# Patient Record
Sex: Female | Born: 1935 | Race: White | Hispanic: No | Smoking: Former smoker
Health system: Southern US, Community
[De-identification: ages and names within clinical notes are randomized; demographics above are authoritative.]

## PROBLEM LIST (undated history)

## (undated) DIAGNOSIS — D500 Iron deficiency anemia secondary to blood loss (chronic): Secondary | ICD-10-CM

## (undated) DIAGNOSIS — H35033 Hypertensive retinopathy, bilateral: Secondary | ICD-10-CM

## (undated) DIAGNOSIS — R1310 Dysphagia, unspecified: Secondary | ICD-10-CM

## (undated) DIAGNOSIS — G4700 Insomnia, unspecified: Secondary | ICD-10-CM

## (undated) DIAGNOSIS — F32A Depression, unspecified: Secondary | ICD-10-CM

## (undated) DIAGNOSIS — R159 Full incontinence of feces: Secondary | ICD-10-CM

## (undated) DIAGNOSIS — F01511 Vascular dementia, unspecified severity, with agitation: Secondary | ICD-10-CM

## (undated) DIAGNOSIS — K5790 Diverticulosis of intestine, part unspecified, without perforation or abscess without bleeding: Secondary | ICD-10-CM

## (undated) DIAGNOSIS — J449 Chronic obstructive pulmonary disease, unspecified: Secondary | ICD-10-CM

## (undated) DIAGNOSIS — R2689 Other abnormalities of gait and mobility: Secondary | ICD-10-CM

## (undated) DIAGNOSIS — K5792 Diverticulitis of intestine, part unspecified, without perforation or abscess without bleeding: Secondary | ICD-10-CM

## (undated) DIAGNOSIS — I639 Cerebral infarction, unspecified: Secondary | ICD-10-CM

## (undated) DIAGNOSIS — Z9049 Acquired absence of other specified parts of digestive tract: Secondary | ICD-10-CM

## (undated) DIAGNOSIS — E7800 Pure hypercholesterolemia, unspecified: Secondary | ICD-10-CM

## (undated) DIAGNOSIS — I10 Essential (primary) hypertension: Secondary | ICD-10-CM

## (undated) DIAGNOSIS — S72141A Displaced intertrochanteric fracture of right femur, initial encounter for closed fracture: Secondary | ICD-10-CM

## (undated) DIAGNOSIS — N189 Chronic kidney disease, unspecified: Secondary | ICD-10-CM

## (undated) DIAGNOSIS — I700 Atherosclerosis of aorta: Secondary | ICD-10-CM

## (undated) DIAGNOSIS — I4892 Unspecified atrial flutter: Secondary | ICD-10-CM

## (undated) DIAGNOSIS — K219 Gastro-esophageal reflux disease without esophagitis: Secondary | ICD-10-CM

## (undated) DIAGNOSIS — I4891 Unspecified atrial fibrillation: Secondary | ICD-10-CM

## (undated) DIAGNOSIS — H3530 Unspecified macular degeneration: Secondary | ICD-10-CM

## (undated) DIAGNOSIS — E785 Hyperlipidemia, unspecified: Secondary | ICD-10-CM

## (undated) HISTORY — DX: Vascular dementia, unspecified severity, with agitation: F01.511

## (undated) HISTORY — DX: Diverticulosis of intestine, part unspecified, without perforation or abscess without bleeding: K57.90

## (undated) HISTORY — DX: Chronic kidney disease, unspecified: N18.9

## (undated) HISTORY — DX: Cerebral infarction, unspecified: I63.9

## (undated) HISTORY — DX: Unspecified atrial flutter: I48.92

## (undated) HISTORY — DX: Hypertensive retinopathy, bilateral: H35.033

## (undated) HISTORY — PX: ABDOMINAL HYSTERECTOMY: SHX81

## (undated) HISTORY — PX: TONSILLECTOMY: SUR1361

## (undated) HISTORY — DX: Diverticulitis of intestine, part unspecified, without perforation or abscess without bleeding: K57.92

## (undated) HISTORY — PX: APPENDECTOMY: SHX54

## (undated) HISTORY — PX: KNEE SURGERY: SHX244

## (undated) HISTORY — DX: Dysphagia, unspecified: R13.10

## (undated) HISTORY — PX: CHOLECYSTECTOMY: SHX55

## (undated) HISTORY — DX: Insomnia, unspecified: G47.00

## (undated) HISTORY — DX: Other abnormalities of gait and mobility: R26.89

## (undated) HISTORY — DX: Gastro-esophageal reflux disease without esophagitis: K21.9

## (undated) HISTORY — DX: Iron deficiency anemia secondary to blood loss (chronic): D50.0

## (undated) HISTORY — DX: Acquired absence of other specified parts of digestive tract: Z90.49

## (undated) HISTORY — DX: Full incontinence of feces: R15.9

## (undated) HISTORY — PX: TOTAL HIP ARTHROPLASTY: SHX124

## (undated) HISTORY — PX: PARTIAL HIP ARTHROPLASTY: SHX733

## (undated) HISTORY — DX: Atherosclerosis of aorta: I70.0

## (undated) HISTORY — DX: Unspecified atrial fibrillation: I48.91

## (undated) HISTORY — DX: Displaced intertrochanteric fracture of right femur, initial encounter for closed fracture: S72.141A

## (undated) HISTORY — DX: Unspecified macular degeneration: H35.30

## (undated) HISTORY — PX: ESOPHAGEAL DILATION: SHX303

## (undated) HISTORY — DX: Depression, unspecified: F32.A

## (undated) HISTORY — DX: Hyperlipidemia, unspecified: E78.5

## (undated) HISTORY — PX: OTHER SURGICAL HISTORY: SHX169

## (undated) HISTORY — DX: Chronic obstructive pulmonary disease, unspecified: J44.9

---

## 2014-10-17 DIAGNOSIS — M7989 Other specified soft tissue disorders: Secondary | ICD-10-CM | POA: Diagnosis not present

## 2014-10-17 DIAGNOSIS — M79609 Pain in unspecified limb: Secondary | ICD-10-CM | POA: Diagnosis not present

## 2014-10-17 DIAGNOSIS — M79605 Pain in left leg: Secondary | ICD-10-CM | POA: Diagnosis not present

## 2014-11-21 DIAGNOSIS — Z0000 Encounter for general adult medical examination without abnormal findings: Secondary | ICD-10-CM | POA: Diagnosis not present

## 2014-11-21 DIAGNOSIS — Z9181 History of falling: Secondary | ICD-10-CM | POA: Diagnosis not present

## 2014-11-21 DIAGNOSIS — S8012XA Contusion of left lower leg, initial encounter: Secondary | ICD-10-CM | POA: Diagnosis not present

## 2014-12-30 DIAGNOSIS — Z1231 Encounter for screening mammogram for malignant neoplasm of breast: Secondary | ICD-10-CM | POA: Diagnosis not present

## 2015-01-14 DIAGNOSIS — R110 Nausea: Secondary | ICD-10-CM | POA: Diagnosis not present

## 2015-01-19 DIAGNOSIS — Z79899 Other long term (current) drug therapy: Secondary | ICD-10-CM | POA: Diagnosis not present

## 2015-01-19 DIAGNOSIS — R1031 Right lower quadrant pain: Secondary | ICD-10-CM | POA: Diagnosis not present

## 2015-01-19 DIAGNOSIS — I10 Essential (primary) hypertension: Secondary | ICD-10-CM | POA: Diagnosis not present

## 2015-01-19 DIAGNOSIS — R197 Diarrhea, unspecified: Secondary | ICD-10-CM | POA: Diagnosis not present

## 2015-01-19 DIAGNOSIS — K219 Gastro-esophageal reflux disease without esophagitis: Secondary | ICD-10-CM | POA: Diagnosis not present

## 2015-01-19 DIAGNOSIS — Z7982 Long term (current) use of aspirin: Secondary | ICD-10-CM | POA: Diagnosis not present

## 2015-01-19 DIAGNOSIS — K769 Liver disease, unspecified: Secondary | ICD-10-CM | POA: Diagnosis not present

## 2015-01-19 DIAGNOSIS — N281 Cyst of kidney, acquired: Secondary | ICD-10-CM | POA: Diagnosis not present

## 2015-01-19 DIAGNOSIS — R109 Unspecified abdominal pain: Secondary | ICD-10-CM | POA: Diagnosis not present

## 2015-01-23 DIAGNOSIS — R197 Diarrhea, unspecified: Secondary | ICD-10-CM | POA: Diagnosis not present

## 2015-01-24 DIAGNOSIS — R197 Diarrhea, unspecified: Secondary | ICD-10-CM | POA: Diagnosis not present

## 2015-03-13 ENCOUNTER — Encounter (HOSPITAL_COMMUNITY): Payer: Self-pay

## 2015-03-13 ENCOUNTER — Emergency Department (HOSPITAL_COMMUNITY): Payer: Medicare Other

## 2015-03-13 ENCOUNTER — Inpatient Hospital Stay (HOSPITAL_COMMUNITY)
Admission: EM | Admit: 2015-03-13 | Discharge: 2015-03-15 | DRG: 310 | Disposition: A | Discharge status: Home / Self Care | Payer: Medicare Other | Attending: Internal Medicine | Admitting: Internal Medicine

## 2015-03-13 DIAGNOSIS — R079 Chest pain, unspecified: Secondary | ICD-10-CM | POA: Diagnosis present

## 2015-03-13 DIAGNOSIS — I10 Essential (primary) hypertension: Secondary | ICD-10-CM | POA: Diagnosis not present

## 2015-03-13 DIAGNOSIS — Z87891 Personal history of nicotine dependence: Secondary | ICD-10-CM

## 2015-03-13 DIAGNOSIS — I209 Angina pectoris, unspecified: Secondary | ICD-10-CM | POA: Diagnosis not present

## 2015-03-13 DIAGNOSIS — Z7982 Long term (current) use of aspirin: Secondary | ICD-10-CM

## 2015-03-13 DIAGNOSIS — E785 Hyperlipidemia, unspecified: Secondary | ICD-10-CM | POA: Diagnosis present

## 2015-03-13 DIAGNOSIS — R61 Generalized hyperhidrosis: Secondary | ICD-10-CM | POA: Diagnosis present

## 2015-03-13 DIAGNOSIS — R0789 Other chest pain: Secondary | ICD-10-CM | POA: Diagnosis not present

## 2015-03-13 DIAGNOSIS — J449 Chronic obstructive pulmonary disease, unspecified: Secondary | ICD-10-CM | POA: Diagnosis not present

## 2015-03-13 DIAGNOSIS — I4891 Unspecified atrial fibrillation: Secondary | ICD-10-CM | POA: Diagnosis not present

## 2015-03-13 DIAGNOSIS — Z79899 Other long term (current) drug therapy: Secondary | ICD-10-CM | POA: Diagnosis not present

## 2015-03-13 HISTORY — DX: Essential (primary) hypertension: I10

## 2015-03-13 HISTORY — DX: Pure hypercholesterolemia, unspecified: E78.00

## 2015-03-13 LAB — CBC
HCT: 41.9 % (ref 36.0–46.0)
Hemoglobin: 14.4 g/dL (ref 12.0–15.0)
MCH: 32 pg (ref 26.0–34.0)
MCHC: 34.4 g/dL (ref 30.0–36.0)
MCV: 93.1 fL (ref 78.0–100.0)
Platelets: 306 10*3/uL (ref 150–400)
RBC: 4.5 MIL/uL (ref 3.87–5.11)
RDW: 12.7 % (ref 11.5–15.5)
WBC: 8.4 10*3/uL (ref 4.0–10.5)

## 2015-03-13 LAB — BASIC METABOLIC PANEL
Anion gap: 10 (ref 5–15)
BUN: 8 mg/dL (ref 6–20)
CO2: 24 mmol/L (ref 22–32)
Calcium: 9.8 mg/dL (ref 8.9–10.3)
Chloride: 107 mmol/L (ref 101–111)
Creatinine, Ser: 0.66 mg/dL (ref 0.44–1.00)
GFR calc Af Amer: >60 mL/min (ref >60)
GFR calc non Af Amer: >60 mL/min (ref >60)
Glucose, Bld: 100 mg/dL — ABNORMAL HIGH (ref 65–99)
Potassium: 4.2 mmol/L (ref 3.5–5.1)
Sodium: 141 mmol/L (ref 135–145)

## 2015-03-13 LAB — TROPONIN I
Troponin I: <0.03 ng/mL (ref <0.031)
Troponin I: <0.03 ng/mL (ref <0.031)

## 2015-03-13 LAB — I-STAT TROPONIN, ED: Troponin i, poc: 0.01 ng/mL (ref 0.00–0.08)

## 2015-03-13 LAB — TSH: TSH: 2.402 u[IU]/mL (ref 0.350–4.500)

## 2015-03-13 MED ORDER — DILTIAZEM HCL 25 MG/5ML IV SOLN
15.0000 mg | Freq: Once | INTRAVENOUS | Status: AC
  Administered 2015-03-13: 15 mg via INTRAVENOUS
  Filled 2015-03-13: qty 5

## 2015-03-13 MED ORDER — ONDANSETRON HCL 4 MG/2ML IJ SOLN: 4.0000 mg | Freq: Four times a day (QID) | INTRAMUSCULAR | Status: DC | PRN

## 2015-03-13 MED ORDER — ACETAMINOPHEN 325 MG PO TABS
650.0000 mg | ORAL_TABLET | ORAL | Status: DC | PRN
  Administered 2015-03-14: 650 mg via ORAL
  Filled 2015-03-13: qty 2

## 2015-03-13 MED ORDER — ASPIRIN EC 81 MG PO TBEC
81.0000 mg | DELAYED_RELEASE_TABLET | Freq: Every day | ORAL | Status: DC
  Administered 2015-03-14 – 2015-03-15 (×2): 81 mg via ORAL
  Filled 2015-03-13 (×2): qty 1

## 2015-03-13 MED ORDER — HEPARIN (PORCINE) IN NACL 100-0.45 UNIT/ML-% IJ SOLN
800.0000 [IU]/h | INTRAMUSCULAR | Status: DC
  Administered 2015-03-13: 800 [IU]/h via INTRAVENOUS
  Filled 2015-03-13: qty 250

## 2015-03-13 MED ORDER — ADULT MULTIVITAMIN W/MINERALS CH
1.0000 | ORAL_TABLET | Freq: Every day | ORAL | Status: DC
  Administered 2015-03-14 – 2015-03-15 (×2): 1 via ORAL
  Filled 2015-03-13 (×2): qty 1

## 2015-03-13 MED ORDER — ASPIRIN 81 MG PO CHEW
243.0000 mg | CHEWABLE_TABLET | Freq: Once | ORAL | Status: AC
  Administered 2015-03-13: 243 mg via ORAL
  Filled 2015-03-13: qty 3

## 2015-03-13 MED ORDER — APIXABAN 5 MG PO TABS
5.0000 mg | ORAL_TABLET | Freq: Two times a day (BID) | ORAL | Status: DC
  Administered 2015-03-13 – 2015-03-15 (×4): 5 mg via ORAL
  Filled 2015-03-13 (×5): qty 1

## 2015-03-13 MED ORDER — HEPARIN BOLUS VIA INFUSION
3000.0000 [IU] | Freq: Once | INTRAVENOUS | Status: AC
  Administered 2015-03-13: 3000 [IU] via INTRAVENOUS
  Filled 2015-03-13: qty 3000

## 2015-03-13 MED ORDER — CALCIUM CARB-CHOLECALCIFEROL 600-800 MG-UNIT PO TABS: 1.0000 | ORAL_TABLET | Freq: Every evening | ORAL | Status: DC

## 2015-03-13 MED ORDER — DILTIAZEM HCL ER COATED BEADS 120 MG PO CP24
120.0000 mg | ORAL_CAPSULE | Freq: Every day | ORAL | Status: DC
  Administered 2015-03-14 – 2015-03-15 (×2): 120 mg via ORAL
  Filled 2015-03-13 (×2): qty 1

## 2015-03-13 MED ORDER — ASPIRIN 81 MG PO CHEW: 324.0000 mg | CHEWABLE_TABLET | Freq: Once | ORAL | Status: DC

## 2015-03-13 MED ORDER — CALCIUM CARBONATE-VITAMIN D 500-200 MG-UNIT PO TABS
1.0000 | ORAL_TABLET | Freq: Every day | ORAL | Status: DC
Start: 2015-03-13 — End: 2015-03-15
  Administered 2015-03-14: 1 via ORAL
  Filled 2015-03-13: qty 1

## 2015-03-13 MED ORDER — PANTOPRAZOLE SODIUM 40 MG PO TBEC
40.0000 mg | DELAYED_RELEASE_TABLET | Freq: Every day | ORAL | Status: DC
  Administered 2015-03-14 – 2015-03-15 (×2): 40 mg via ORAL
  Filled 2015-03-13 (×2): qty 1

## 2015-03-13 MED ORDER — DILTIAZEM HCL 30 MG PO TABS
30.0000 mg | ORAL_TABLET | Freq: Two times a day (BID) | ORAL | Status: DC
  Administered 2015-03-13: 30 mg via ORAL
  Filled 2015-03-13 (×2): qty 1

## 2015-03-13 MED ORDER — COLESTIPOL HCL 1 G PO TABS
1.0000 g | ORAL_TABLET | Freq: Two times a day (BID) | ORAL | Status: DC
  Administered 2015-03-13 – 2015-03-15 (×4): 1 g via ORAL
  Filled 2015-03-13 (×7): qty 1

## 2015-03-13 NOTE — ED Provider Notes (Signed)
CSN: [id number]     Arrival date & time 03/13/15  1014 History   First MD Initiated Contact with Patient 03/13/15 1024     Chief Complaint  Patient presents with  . Atrial Fibrillation   HPI  Gail Mcclure is a 81yo female presenting with atrial fibrillation. Woke up this morning with LUQ pain and diaphoresis. States the diaphoresis was so significant that she was soaking wet and had to change her pajamas. Also states she was very weak and had a hard time getting out of bed. Has continued to feel weak all morning, however LUQ pain has resolved. Initially presented to Urgent Care and was noted to have atrial fibrillation and possible inferior infarct and was sent to ED. Denies history of heart problems. States she [place], but for the last month she has only been able to make two laps due to shortness of breath. Also notes left leg swelling. Denies chest pain or radiation to the left arm or neck. Denies orthopnea and paroxysmal nocturnal dyspnea. Denies palpitations.  Past Medical History  Diagnosis Date  . Hypertension   . High cholesterol    Past Surgical History  Procedure Laterality Date  . Abdominal hysterectomy    . Cholecystectomy    . Appendectomy    . Tonsillectomy    . Knee surgery     History reviewed. No pertinent family history. Social History  Substance Use Topics  . Smoking status: Former Games developer  . Smokeless tobacco: None  . Alcohol Use: No   OB History    No data available     Review of Systems  Constitutional: Positive for diaphoresis.  Respiratory: Positive for shortness of breath.   Cardiovascular: Positive for leg swelling. Negative for chest pain and palpitations.  Gastrointestinal: Positive for abdominal pain. Negative for nausea, vomiting, diarrhea and constipation.  Neurological: Positive for weakness.      Allergies  Review of patient's allergies indicates no known allergies.  Home Medications   Prior to  Admission medications   Medication Sig Start Date End Date Taking? Authorizing Provider  acetaminophen (TYLENOL) 500 MG tablet Take 1,000 mg by mouth every 6 (six) hours as needed for mild pain.   Yes Historical Provider, MD  amLODipine (NORVASC) 10 MG tablet Take 10 mg by mouth daily. 12/14/14  Yes Historical Provider, MD  aspirin EC 81 MG tablet Take 81 mg by mouth daily.   Yes Historical Provider, MD  Calcium Carb-Cholecalciferol (CALCIUM 600/VITAMIN D3) 600-800 MG-UNIT TABS Take 1 tablet by mouth every evening.   Yes Historical Provider, MD  colestipol (COLESTID) 1 G tablet Take 1 g by mouth 2 (two) times daily. 02/24/15  Yes Historical Provider, MD  Multiple Vitamin (MULTIVITAMIN WITH MINERALS) TABS tablet Take 1 tablet by mouth daily.   Yes Historical Provider, MD  omeprazole (PRILOSEC) 20 MG capsule Take 20 mg by mouth daily. 02/20/15  Yes Historical Provider, MD   BP 152/56 mmHg  Pulse 98  Temp(Src) 97.9 F (36.6 C) (Oral)  Resp 23  Ht 5\' 5"  (1.651 m)  Wt 143 lb (64.864 kg)  BMI 23.80 kg/m2  SpO2 97% Physical Exam  Constitutional: She is oriented to person, place, and time. She appears well-developed and well-nourished. No distress.  HENT:  Head: Normocephalic and atraumatic.  Cardiovascular: Normal rate and regular rhythm.  Exam reveals no gallop and no friction rub.   No murmur heard. Pulmonary/Chest: Effort normal. No respiratory distress. She has no rales.  Abdominal: Soft. Bowel sounds are normal. She exhibits no distension. There is no tenderness.  Musculoskeletal: She exhibits no tenderness.  Nonpitting edema of left leg  Neurological: She is alert and oriented to person, place, and time.  Skin: Skin is warm and dry. No rash noted. She is not diaphoretic.  Psychiatric: She has a normal mood and affect. Her behavior is normal.    ED Course  Procedures (including critical care time) Labs Review Labs Reviewed  BASIC METABOLIC PANEL - Abnormal; Notable for the following:     Glucose, Bld 100 (*)    All other components within normal limits  CBC  TROPONIN I  TSH  TROPONIN I  TROPONIN I  HEPARIN LEVEL (UNFRACTIONATED)  Rosezena Sensor, ED    Imaging Review Dg Chest 2 View  03/13/2015   CLINICAL DATA:  New onset atrial fibrillation. Patient awoke with chest pain last night.  EXAM: CHEST  2 VIEW  COMPARISON:  None.  FINDINGS: Hyperinflation is present compatible with emphysema. Osteopenia. Mild thoracic kyphosis.  The cardiopericardial silhouette is within normal limits. No airspace disease. No effusion. Monitoring leads project over the chest.  IMPRESSION: No acute cardiopulmonary disease. Mild hyperinflation suggesting emphysema.   Electronically Signed   By: Andreas Newport M.D.   On: 03/13/2015 11:59   I have personally reviewed and evaluated these images and lab results as part of my medical decision-making.   EKG Interpretation   Date/Time:  Thursday March 13 2015 10:17:21 EDT Ventricular Rate:  126 PR Interval:    QRS Duration: 86 QT Interval:  293 QTC Calculation: 424 R Axis:   80 Text Interpretation:  Atrial flutter Low voltage, precordial leads ST  depression, probably rate related Confirmed by Juleen China  MD, STEPHEN ([id number])  on 03/13/2015 10:57:05 AM      MDM   Final diagnoses:  None   New onset atrial fibrillation. Troponin normal. BMP normal. CBC normal. TSH normal. CXR without active cardiopulmonary disease.    Hospitalist contacted and will admit for workup of new onset atrial fibrillation. Anticipate need for echo given atrial fibrillation and decreased exercise tolerance.     [place], [place] 03/13/15 [address] [place], [place] 03/13/15 1416  Raeford Razor, MD 03/14/15 0900

## 2015-03-13 NOTE — Progress Notes (Addendum)
ANTICOAGULATION CONSULT NOTE - Initial Consult  Pharmacy Consult for heparin Indication: atrial fibrillation  No Known Allergies  Patient Measurements: Height:  (165.1 cm) Weight: 143 lb (64.864 kg) IBW/kg (Calculated) : 57 Heparin Dosing Weight: 64.8kg  Vital Signs: Temp: 97.9 F (36.6 C) (08/18 1018) Temp Source: Oral (08/18 1018) BP: 152/56 mmHg (08/18 1345) Pulse Rate: 98 (08/18 1345)  Labs:  Recent Labs  03/13/15 1115  HGB 14.4  HCT 41.9  PLT 306  CREATININE 0.66  TROPONINI <0.03    Estimated Creatinine Clearance: 44.6 mL/min (by C-G formula based on Cr of 0.66).   Medical History: Past Medical History  Diagnosis Date  . Hypertension   . High cholesterol     Assessment: 61 YOF here with left flank pain and diaphoresis who was seen at urgent care and sent to ED with EKG showing AFib, Patient not on anticoagulation PTA. Initial troponin negative, TSH nml at 2.4.  Baseline hgb 14.4, plts 306- no bleeding noted at baseline.  Goal of Therapy:  Heparin level 0.3-0.7 units/ml Monitor platelets by anticoagulation protocol: Yes   Plan:  -heparin bolus with 3000 units IV x1, then start infusion at 800 units/hr -HL in 8h -daily HL and CBC -follow for s/s bleeding  Lauren D. Bajbus, PharmD, BCPS Clinical Pharmacist Pager: [phone] 03/13/2015 2:07 PM   Addendum: Now transitioning to apixaban. Age 81, weight 64.9kg and Scr 0.66 so  BID is appropriate for her. Baseline CBC is WNL.  Plan: - Apixaban  PO BID - F/u renal fxn, S&S of bleeding - Will educate patient prior to discharge  Lysle Pearl, PharmD, BCPS Pager # [phone] 03/13/2015 3:17 PM

## 2015-03-13 NOTE — ED Provider Notes (Signed)
I saw and evaluated the patient, reviewed the resident's note and I agree with the findings and plan.   EKG Interpretation   Date/Time:  Thursday March 13 2015 10:17:21 EDT Ventricular Rate:  126 PR Interval:    QRS Duration: 86 QT Interval:  293 QTC Calculation: 424 R Axis:   80 Text Interpretation:  Atrial flutter Low voltage, precordial leads ST  depression, probably rate related Confirmed by Sue Fernicola  MD, Shyvonne Chastang ([id number])  on 03/13/2015 10:57:05 AM      89yF with new onset afib. Onset not completely clear. Reports L sided CP while laying in bed this morning. Atypical in that it occurred at rest and only lasted about a minute. Currently resolved and has not had it since. On ROS she reports about a two week history of increased fatigue/exercise intolerance. Monday -Thursday she walks around the [facility] in [place] four times. Over the past couple weeks she has only been able to manage walking around twice. She has had to stop sooner because she felt very tired. Denies having any pain with this.   No known hx of CAD. CHADSVASC score of 4 with advanced age, female and hx of HTN.   Rate ~100-130. Will try to rate control with cardizem. Aspirin for now. Will discuss with cardiology in terms of anticoagulation and other medication recommendations as well potential need for ischemic work-up.   Raeford Razor, MD 03/13/15 [phone]

## 2015-03-13 NOTE — Discharge Planning (Signed)
ER NCM requested benefits check for Xarelto, Pradaxa and Eliquis.  Will update bedside NCM when results become available.

## 2015-03-13 NOTE — ED Notes (Signed)
Pt on bedside commode. Family in room with pt. Informed to let staff know when pt is finished and wishes to return to the bed so that assistance can be provided.

## 2015-03-13 NOTE — Consult Note (Signed)
CARDIOLOGY CONSULT NOTE   Patient ID: Gail Mcclure MRN: [id number] DOB/AGE: August 19, 1925 81 y.o.  Admit date: 03/13/2015  Primary Physician   Desmond Dike, MD Primary Cardiologist  New  Reason for Consultation   Afib with RVR  HPI: Gail Mcclure is a 81 y.o. female with a history of HTN and HL, former smoker (quit 53 years ago) presented to [facility] ED for evaluation of weakness and fatigue.   No other prior cardiac hx. She walks 4 laps around [facility] daily however decreased to lap secondary to shortness of breath. Over 2 week period she is also noticed increased focal left ankle swelling. This morning she woke up with a diaphoresis and dizziness and then she suddenly felt Left lateral chest discomfort. Described a mild ache, last for about 1 min and subsided by itself. She denies palpitation, orthopnea or PND. About 2-4 weeks ago patient had a diarrhea, now resoled. She mows her 2 acre lawn on a riding lawnmower, recently hired an individual. She presented here today for further evaluation.    In the ER the patient was afebrile and found to have an atrial fibrillation with RVR at rate of 126, BP was 155/74.  Patient was given a dose of IV Cardizem with subsequent decrease in heart rate, conversion to sinus rhythm. Discontinued IV dilt, currently in rate controlled afib.   initial troponin was less than 0.03, TSH was 2.4, chest x-ray revealed mild hyperinflation suggestive of emphysema.  Cardiology is consulted for further management.    Past Medical History  Diagnosis Date  . Hypertension   . High cholesterol      Past Surgical History  Procedure Laterality Date  . Abdominal hysterectomy    . Cholecystectomy    . Appendectomy    . Tonsillectomy    . Knee surgery      No Known Allergies  I have reviewed the patient's current medications . aspirin EC  81 mg Oral Daily  . diltiazem  30 mg Oral Q12H  . heparin  3,000 Units Intravenous Once   . heparin      acetaminophen, ondansetron (ZOFRAN) IV  Prior to Admission medications   Medication Sig Start Date End Date Taking? Authorizing Provider  acetaminophen (TYLENOL) 500 MG tablet Take 1,000 mg by mouth every 6 (six) hours as needed for mild pain.   Yes Historical Provider, MD  amLODipine (NORVASC) 10 MG tablet Take 10 mg by mouth daily. 12/14/14  Yes Historical Provider, MD  aspirin EC 81 MG tablet Take 81 mg by mouth daily.   Yes Historical Provider, MD  Calcium Carb-Cholecalciferol (CALCIUM 600/VITAMIN D3) 600-800 MG-UNIT TABS Take 1 tablet by mouth every evening.   Yes Historical Provider, MD  colestipol (COLESTID) 1 G tablet Take 1 g by mouth 2 (two) times daily. 02/24/15  Yes Historical Provider, MD  Multiple Vitamin (MULTIVITAMIN WITH MINERALS) TABS tablet Take 1 tablet by mouth daily.   Yes Historical Provider, MD  omeprazole (PRILOSEC) 20 MG capsule Take 20 mg by mouth daily. 02/20/15  Yes Historical Provider, MD     Social History   Social History  . Marital Status: Unknown    Spouse Name: N/A  . Number of Children: N/A  . Years of Education: N/A   Occupational History  . Not on file.   Social History Main Topics  . Smoking status: Former Games developer  . Smokeless tobacco: Not on file  . Alcohol Use: No  . Drug Use: No  . Sexual Activity:  Not on file   Other Topics Concern  . Not on file   Social History Narrative  . No narrative on file    No family status information on file.   History reviewed. No pertinent family history.   ROS:  Full 14 point review of systems complete and found to be negative unless listed above.  Physical Exam: Blood pressure 135/75, pulse 93, temperature 97.9 F (36.6 C), temperature source Oral, resp. rate 26, height 5\' 5"  (1.651 m), weight 143 lb (64.864 kg), SpO2 97 %.  General: Well developed, well nourished, female in no acute distress Head: Eyes PERRLA, No xanthomas. Normocephalic and atraumatic, oropharynx without edema or exudate.   Lungs: Resp regular and unlabored. Basilar expiratory wheezing.  Heart: irregular rhythm no s3, s4, or murmurs..   Neck: No carotid bruits. No lymphadenopathy.  JVD. Abdomen: Bowel sounds present, abdomen soft and non-tender without masses or hernias noted. Msk:  No spine or cva tenderness. No weakness, no joint deformities or effusions. Extremities: No clubbing, cyanosis or edema. DP/PT/Radials 2+ and equal bilaterally. Neuro: Alert and oriented X 3. No focal deficits noted. Psych:  Good affect, responds appropriately Skin: No rashes or lesions noted.  Labs:   Lab Results  Component Value Date   WBC 8.4 03/13/2015   HGB 14.4 03/13/2015   HCT 41.9 03/13/2015   MCV 93.1 03/13/2015   PLT 306 03/13/2015   No results for input(s): INR in the last 72 hours.  Recent Labs Lab 03/13/15 1115  NA 141  K 4.2  CL 107  CO2 24  BUN 8  CREATININE 0.66  CALCIUM 9.8  GLUCOSE 100*   No results found for: MG  Recent Labs  03/13/15 1115  TROPONINI <0.03    Recent Labs  03/13/15 1143  TROPIPOC 0.01    TSH  Date/Time Value Ref Range Status  03/13/2015 11:15 AM 2.402 0.350 - 4.500 uIU/mL Final   No results found for: VITAMINB12, FOLATE, FERRITIN, TIBC, IRON, RETICCTPCT  Echo: pending  ECG:  Vent. rate 126 BPM PR interval * ms QRS duration 86 ms QT/QTc 293/424 ms P-R-T axes -1 80 14  Radiology:  Dg Chest 2 View  03/13/2015   CLINICAL DATA:  New onset atrial fibrillation. Patient awoke with chest pain last night.  EXAM: CHEST  2 VIEW  COMPARISON:  None.  FINDINGS: Hyperinflation is present compatible with emphysema. Osteopenia. Mild thoracic kyphosis.  The cardiopericardial silhouette is within normal limits. No airspace disease. No effusion. Monitoring leads project over the chest.  IMPRESSION: No acute cardiopulmonary disease. Mild hyperinflation suggesting emphysema.   Electronically Signed   By: Andreas Newport M.D.   On: 03/13/2015 11:59    ASSESSMENT AND PLAN:     1.  New onset atrial fibrillation  - CHADVASc = 4 (Age 48, sex and HTN). - Presented wit Afib RVR, rate improved on IV dilt, now discontinued. Currently in rate controlled afib.  - TSH normal, Echo pending - Unknown etiology and duration. Less likely of ischemic etiology. She woke up with a diaphoresis and then had left lateral dull achy pain, resolved in 1 minute.  - Will place her on a Cardizem CD 120 and eliquis per pharmacy.   2. Atypical chest pain - Top X 1 negative. EKG without acute abnormality. Continue cycle trop. Lexiscan tomorrow. NPO after midnight.  - Continue ASA - Discontinue IV heparin  3. HTN - Stable. Held home dose of norvasc 10mg .    4. HLD  - She  was on simvastatin, recently placed on Colestipol 1g tablet due to diarrhea.   SignedManson Passey, PA 03/13/2015, 2:23 PM Pager [phone]  Co-Sign MD   I have seen and examined the patient along with Bhagat,Bhavinkumar, PA.  I have reviewed the chart, notes and new data.  I agree with PA's note.  Key new complaints: feeling much better, but not back to baseline; chest pressure earlier today may have been angina; symptoms suggest arrhythmia may be going on for last week Key examination changes: no HF signs, lying fully supine, rhythm still irregular 80s, no murmurs Key new findings / data: mild ST depression on ECg  PLAN: Echo [employer] direct oral anticoagulant (for example: Eliquis 5 mg BID) Oral diltiazem No need for early cardioversion or antiarrhythmics - reassess symptoms in 3 weeks. If symptoms are well controlled, continue rate control strategy.   Gail Fair, MD, [facility] [phone] 03/13/2015, 3:45 PM

## 2015-03-13 NOTE — H&P (Signed)
[facility] History and Physical                                                                                    Gail Mcclure, is a 81 y.o. female  MRN: [id number]   DOB - September 05, 1925  Admit Date - 03/13/2015  Outpatient Primary MD for the patient is Desmond Dike, MD  Referring MD: Juleen China / ER  Consulting M.D: [facility]  With History of -  Past Medical History  Diagnosis Date  . Hypertension   . High cholesterol       Past Surgical History  Procedure Laterality Date  . Abdominal hysterectomy    . Cholecystectomy    . Appendectomy    . Tonsillectomy    . Knee surgery      in for   Chief Complaint  Patient presents with  . Atrial Fibrillation     HPI This is a very active 81 year old female patient with past medical history of hypertension and dyslipidemia as well as a remote history of tobacco abuse and found to have changes consistent with COPD on today's chest x-ray who presents with left lateral anterior chest pain as well as diaphoresis and weakness. Patient awakened around 2 AM with the above symptoms. She presented to an urgent care was found to be in atrial fibrillation so was sent to [facility] for further evaluation. Patient has noted over the past 2 weeks decrease in activity tolerance. Typically she walks 4 laps around [facility] daily for the past 2 weeks has decreased to lap secondary to shortness of breath. Over 2 week period she is also noticed increased focal left ankle swelling. He is not had anywhere as a palpitations, this morning with her symptoms she had left-sided chest discomfort and dizziness, diaphoresis and profound weakness. Of note this patient typically mows her 2 acre lawn on a riding lawnmower over the past 4 weeks because of excessive heat she had hired an individual to do this for her.  In the ER the patient was afebrile, pulse was anywhere between 96 and 122 bpm, rhythm was atrial fibrillation, BP was 155/74. RA saturations  were 96%. Patient was given a dose of IV Cardizem with subsequent decrease in heart rate and conversion to sinus rhythm. Laboratory data was unremarkable except for mildly elevated glucose of 100, initial troponin was less than 0.03, TSH was 2.4, chest x-ray revealed mild hyperinflation suggestive of emphysema.   Review of Systems   In addition to the HPI above,  No Fever-chills, myalgias or other constitutional symptoms No Headache, changes with Vision or hearing, new weakness, tingling, numbness in any extremity, No problems swallowing food or Liquids, indigestion/reflux No Cough or palpitations, orthopnea  No Abdominal pain, N/V; no melena or hematochezia, no dark tarry stools, Bowel movements are regular, No dysuria, hematuria or flank pain No new skin rashes, lesions, masses or bruises, No new joints pains-aches No recent weight gain or loss No polyuria, polydypsia or polyphagia,  *A full 10 point Review of Systems was done, except as stated above, all other Review of Systems were negative.  Social History Social History  Substance Use Topics  . Smoking  status: Former Games developer  . Smokeless tobacco: Not on file  . Alcohol Use: No    Resides at: Private residence  Lives with: Alone  Ambulatory status: Without assistive devices   Family History Mother-(deceased) secondary to pancreatic cancer Sister-diabetes mellitus Sister-(deceased) history of CAD and CVA   Prior to Admission medications   Medication Sig Start Date End Date Taking? Authorizing Provider  acetaminophen (TYLENOL) 500 MG tablet Take 1,000 mg by mouth every 6 (six) hours as needed for mild pain.   Yes Historical Provider, MD  amLODipine (NORVASC) 10 MG tablet Take 10 mg by mouth daily. 12/14/14  Yes Historical Provider, MD  aspirin EC 81 MG tablet Take 81 mg by mouth daily.   Yes Historical Provider, MD  Calcium Carb-Cholecalciferol (CALCIUM 600/VITAMIN D3) 600-800 MG-UNIT TABS Take 1 tablet by mouth every  evening.   Yes Historical Provider, MD  colestipol (COLESTID) 1 G tablet Take 1 g by mouth 2 (two) times daily. 02/24/15  Yes Historical Provider, MD  Multiple Vitamin (MULTIVITAMIN WITH MINERALS) TABS tablet Take 1 tablet by mouth daily.   Yes Historical Provider, MD  omeprazole (PRILOSEC) 20 MG capsule Take 20 mg by mouth daily. 02/20/15  Yes Historical Provider, MD    No Known Allergies  Physical Exam  Vitals  Blood pressure 152/56, pulse 98, temperature 97.9 F (36.6 C), temperature source Oral, resp. rate 23, height  (1.651 m), weight 143 lb (64.864 kg), SpO2 97 %.   General:  In no acute distress, appears healthy, well nourished and younger than stated age  Psych:  Normal affect, Denies Suicidal or Homicidal ideations, Awake Alert, Oriented X 3. Speech and thought patterns are clear and appropriate, no apparent short term memory deficits  Neuro:   No focal neurological deficits, CN II through XII intact, Strength 5/5 all 4 extremities, Sensation intact all 4 extremities.  ENT:  Ears and Eyes appear Normal, Conjunctivae clear, PER. Moist oral mucosa without erythema or exudates.  Neck:  Supple, No lymphadenopathy appreciated  Respiratory:  Symmetrical chest wall movement, Good air movement bilaterally, CTAB. Room Air  Cardiac:  RRR, No Murmurs, subtle focal LE edema noted primarily at ankle, no JVD, No carotid bruits, peripheral pulses palpable at 2+  Abdomen:  Positive bowel sounds, Soft, Non tender, Non distended,  No masses appreciated, no obvious hepatosplenomegaly  Skin:  No Cyanosis, Normal Skin Turgor, No Skin Rash or Bruise.  Extremities: Symmetrical without obvious trauma or injury,  no effusions.  Data Review  CBC  Recent Labs Lab 03/13/15 1115  WBC 8.4  HGB 14.4  HCT 41.9  PLT 306  MCV 93.1  MCH 32.0  MCHC 34.4  RDW 12.7    Chemistries   Recent Labs Lab 03/13/15 1115  NA 141  K 4.2  CL 107  CO2 24  GLUCOSE 100*  BUN 8  CREATININE 0.66    CALCIUM 9.8    estimated creatinine clearance is 44.6 mL/min (by C-G formula based on Cr of 0.66).   Recent Labs  03/13/15 1115  TSH 2.402    Coagulation profile No results for input(s): INR, PROTIME in the last 168 hours.  No results for input(s): DDIMER in the last 72 hours.  Cardiac Enzymes  Recent Labs Lab 03/13/15 1115  TROPONINI <0.03    Invalid input(s): POCBNP  Urinalysis No results found for: COLORURINE, APPEARANCEUR, LABSPEC, PHURINE, GLUCOSEU, HGBUR, BILIRUBINUR, KETONESUR, PROTEINUR, UROBILINOGEN, NITRITE, LEUKOCYTESUR  Imaging results:   Dg Chest 2 View  03/13/2015  CLINICAL DATA:  New onset atrial fibrillation. Patient awoke with chest pain last night.  EXAM: CHEST  2 VIEW  COMPARISON:  None.  FINDINGS: Hyperinflation is present compatible with emphysema. Osteopenia. Mild thoracic kyphosis.  The cardiopericardial silhouette is within normal limits. No airspace disease. No effusion. Monitoring leads project over the chest.  IMPRESSION: No acute cardiopulmonary disease. Mild hyperinflation suggesting emphysema.   Electronically Signed   By: Andreas Newport M.D.   On: 03/13/2015 11:59     EKG: (Independently reviewed) atrial fibrillation with ventricular response of 126 bpm, QTC 424 ms, no definitive ischemic changes noted   Assessment & Plan  Principal Problem:   New onset atrial fibrillation (CHADVASc = 4) -Admit to telemetry/3 Chad -Cardiology consulted -Pharmacy consulted to begin full dose heparin -Begin short-acting low-dose Cardizem to maintain rate control -Currently maintaining sinus rhythm -Case management consulted to assist with determining co-pay for NOAC -TSH normal  Active Problems:   Chest pain -Likely related to RVR prior to admission -Initial EKG and troponin unremarkable regarding ischemia -As precaution cycle troponin -Echocardiogram    HTN  -Was on Norvasc prior to admission but have discontinued in favor of Cardizem for  rate control -Current blood pressure controlled    HLD -On colestipol prior to admission    Early COPD -Based on recent x-ray noting patient asymptomatic -Remote history of tobacco abuse     DVT Prophylaxis: Full dose IV heparin  Family Communication:   Son and daughter-in-law at bedside  Code Status:  Full code  Condition:  Stable  Discharge disposition: Anticipate discharge back to home in next 2-3 days pending resolution of RVR and determination regarding need for potential ischemic evaluation inpatient versus outpatient  Time spent in minutes : 60      ELLIS,ALLISON L. ANP on 03/13/2015 at 1:55 PM  Between 7am to 7pm - Pager - [phone]  After 7pm go to [email or web address] - password [email or web address]  And look for the night coverage person covering me after hours  [facility]  Addendum  I personally evaluated patient on 03/13/2015 and agree with the above findings. Gail Mcclure is a pleasant 81 year old female who presently resides alone in the community, is highly functional, independent on all instrumental activities of daily living, presented to the emergency department with complaints of generalized weakness associated with left-sided chest pain and diaphoresis. She states that symptoms started at approximately 3 AM this morning. She reports having progressive weakness over the past 2 weeks. On admission shows found to be in atrial fibrillation. Lab work thus far reveals negative troponin, TSH of 2.4, chest x-ray that did not reveal acute infiltrate. On exam she had an irregular rate and rhythm, normal S1-S2, good air movement on lung exam no wheezing rhonchi or rales. She had mild left ankle edema. She has a CHADVasc score of 4 and would benefit from anticoagulation. Will also start her on Cardizem 120 mg by mouth daily. Further workup with transthoracic echocardiogram. Cardiology  Consulted. Await further recommendations.

## 2015-03-13 NOTE — ED Notes (Signed)
Pt reports waking up at 0200 this morning with left flank pain and diaphoresis.  Pt reports the pain subsided but returned around breakfast time.  Pt was seen at urgent care and EKG showed Afib.  Pt has no hx.  Pt denies any pain at this time but reports weakness.

## 2015-03-14 ENCOUNTER — Inpatient Hospital Stay (HOSPITAL_COMMUNITY): Payer: Medicare Other

## 2015-03-14 ENCOUNTER — Ambulatory Visit (HOSPITAL_COMMUNITY): Payer: Medicare Other

## 2015-03-14 DIAGNOSIS — R079 Chest pain, unspecified: Secondary | ICD-10-CM

## 2015-03-14 DIAGNOSIS — I10 Essential (primary) hypertension: Secondary | ICD-10-CM

## 2015-03-14 DIAGNOSIS — E785 Hyperlipidemia, unspecified: Secondary | ICD-10-CM

## 2015-03-14 DIAGNOSIS — I4891 Unspecified atrial fibrillation: Secondary | ICD-10-CM

## 2015-03-14 LAB — NM MYOCAR MULTI W/SPECT W/WALL MOTION / EF
CHL CUP MPHR: 133 {beats}/min
CHL CUP NUCLEAR SDS: 3
CSEPHR: 78 %
CSEPPHR: 105 {beats}/min
LHR: 0.31
LV sys vol: 9 mL
LVDIAVOL: 40 mL
Rest HR: 68 {beats}/min
SRS: 5
SSS: 7
TID: 1.03

## 2015-03-14 LAB — CBC
HCT: 39.1 % (ref 36.0–46.0)
HEMOGLOBIN: 13.1 g/dL (ref 12.0–15.0)
MCH: 31.6 pg (ref 26.0–34.0)
MCHC: 33.5 g/dL (ref 30.0–36.0)
MCV: 94.4 fL (ref 78.0–100.0)
Platelets: 285 10*3/uL (ref 150–400)
RBC: 4.14 MIL/uL (ref 3.87–5.11)
RDW: 12.9 % (ref 11.5–15.5)
WBC: 6.2 10*3/uL (ref 4.0–10.5)

## 2015-03-14 LAB — TROPONIN I: Troponin I: <0.03 ng/mL (ref <0.031)

## 2015-03-14 MED ORDER — TECHNETIUM TC 99M SESTAMIBI GENERIC - CARDIOLITE
10.0000 | Freq: Once | INTRAVENOUS | Status: AC | PRN
  Administered 2015-03-14: 10 via INTRAVENOUS

## 2015-03-14 MED ORDER — REGADENOSON 0.4 MG/5ML IV SOLN
INTRAVENOUS | Status: AC
  Administered 2015-03-14: 0.4 mg
  Filled 2015-03-14: qty 5

## 2015-03-14 MED ORDER — TECHNETIUM TC 99M SESTAMIBI GENERIC - CARDIOLITE
30.0000 | Freq: Once | INTRAVENOUS | Status: AC | PRN
  Administered 2015-03-14: 30 via INTRAVENOUS

## 2015-03-14 NOTE — Care Management Note (Addendum)
Case Management Note  Patient Details  Name: Gail Mcclure MRN: [id number] Date of Birth: 07-Jan-1926  Subjective/Objective:  Pt admitted for Afib. Pt is from home and will need eliquis once stable for d/c.                   Action/Plan: Benefits check completed and cost of $4.00. CM did call [facility] and medication can be ordered- not available at this time. CM did call [facility] on [facility] [address] and medication is available.  No further needs at this time.   [name], RN 03/14/2015, 2:49 PM

## 2015-03-14 NOTE — Progress Notes (Signed)
[facility] PROGRESS NOTE   Gail Mcclure ZOX:[id number] DOB: Apr 14, 1926 DOA: 03/13/2015 PCP: Desmond Dike, MD  HPI/Subjective: Seen with son and daughter-in-law at bedside. Denies any chest pain, had a stress test earlier today and it was negative.  Assessment/Plan: Principal Problem:   New onset atrial fibrillation (CHADVASc = 4) Active Problems:   HTN (hypertension)   Chest pain   HLD (hyperlipidemia)    New onset atrial fibrillation (CHADVASc = 4) -Admit to telemetry/3 Chad -Cardiology consulted -Pharmacy consulted to begin full dose heparin -Begin short-acting low-dose Cardizem to maintain rate control -Currently maintaining sinus rhythm -Placed on Eliquis and Cardizem CD appears to be okay.   Chest pain -Likely related to RVR prior to admission -3 sets of cardiac enzymes are negative, stress test is low risk stress test. -Echocardiogram pending   HTN  -Was on Norvasc prior to admission but have discontinued in favor of Cardizem for rate control -Current blood pressure controlled   HLD -On colestipol prior to admission   Early COPD -Based on recent x-ray noting patient asymptomatic -Remote history of tobacco abuse  Code Status: Full Code Family Communication: Plan discussed with the patient. Disposition Plan: Remains inpatient Diet: Diet Heart Room service appropriate?: Yes; Fluid consistency:: Thin  Consultants:  Cardiology*  Procedures:  Stress test showed low risk findings  Antibiotics:  None   Objective: Filed Vitals:   03/14/15 1315  BP: 119/54  Pulse: 81  Temp: 98.2 F (36.8 C)  Resp: 18    Intake/Output Summary (Last 24 hours) at 03/14/15 1820 Last data filed at 03/14/15 1231  Gross per 24 hour  Intake    480 ml  Output      0 ml  Net    480 ml   Filed Weights   03/13/15 1018 03/13/15 1555  Weight: 64.864 kg (143 lb) 63.9 kg (140 lb 14 oz)    Exam: General: Alert and awake, oriented x3, not in any acute  distress. HEENT: anicteric sclera, pupils reactive to light and accommodation, EOMI CVS: S1-S2 clear, no murmur rubs or gallops Chest: clear to auscultation bilaterally, no wheezing, rales or rhonchi Abdomen: soft nontender, nondistended, normal bowel sounds, no organomegaly Extremities: no cyanosis, clubbing or edema noted bilaterally Neuro: Cranial nerves II-XII intact, no focal neurological deficits  Data Reviewed: Basic Metabolic Panel:  Recent Labs Lab 03/13/15 1115  NA 141  K 4.2  CL 107  CO2 24  GLUCOSE 100*  BUN 8  CREATININE 0.66  CALCIUM 9.8   Liver Function Tests: No results for input(s): AST, ALT, ALKPHOS, BILITOT, PROT, ALBUMIN in the last 168 hours. No results for input(s): LIPASE, AMYLASE in the last 168 hours. No results for input(s): AMMONIA in the last 168 hours. CBC:  Recent Labs Lab 03/13/15 1115 03/14/15 0554  WBC 8.4 6.2  HGB 14.4 13.1  HCT 41.9 39.1  MCV 93.1 94.4  PLT 306 285   Cardiac Enzymes:  Recent Labs Lab 03/13/15 1115 03/13/15 1708 03/14/15 0015 03/14/15 0554  TROPONINI <0.03 <0.03 <0.03 <0.03   BNP (last 3 results) No results for input(s): BNP in the last 8760 hours.  ProBNP (last 3 results) No results for input(s): PROBNP in the last 8760 hours.  CBG: No results for input(s): GLUCAP in the last 168 hours.  Micro No results found for this or any previous visit (from the past 240 hour(s)).   Studies: Dg Chest 2 View  03/13/2015   CLINICAL DATA:  New onset atrial fibrillation. Patient awoke with chest  pain last night.  EXAM: CHEST  2 VIEW  COMPARISON:  None.  FINDINGS: Hyperinflation is present compatible with emphysema. Osteopenia. Mild thoracic kyphosis.  The cardiopericardial silhouette is within normal limits. No airspace disease. No effusion. Monitoring leads project over the chest.  IMPRESSION: No acute cardiopulmonary disease. Mild hyperinflation suggesting emphysema.   Electronically Signed   By: Andreas Newport M.D.    On: 03/13/2015 11:59   Nm Myocar Multi W/spect W/wall Motion / Ef  03/14/2015    There was no ST segment deviation noted during stress.  The study is normal.  This is a low risk study. No ischemia  Nuclear stress EF: 78%.     Scheduled Meds: . apixaban  5 mg Oral BID  . aspirin EC  81 mg Oral Daily  . calcium-vitamin D  1 tablet Oral Q supper  . colestipol  1 g Oral BID  . diltiazem  120 mg Oral Daily  . multivitamin with minerals  1 tablet Oral Daily  . pantoprazole  40 mg Oral Daily   Continuous Infusions:      Time spent: 35 minutes    [facility] A  [facility] Pager [phone] If 7PM-7AM, please contact night-coverage at [email or web address], password [facility] 03/14/2015, 6:20 PM  LOS: 1 day

## 2015-03-14 NOTE — Discharge Planning (Addendum)
Benefits check are as follows:  All 3 medications are a tier 3, all required authorization ph [id number], all have a $4 copay at retail for 30 day supply.

## 2015-03-14 NOTE — Progress Notes (Signed)
Patient Name: Gail Mcclure Date of Encounter: 03/14/2015     Principal Problem:   New onset atrial fibrillation (CHADVASc = 4) Active Problems:   HTN (hypertension)   Chest pain   HLD (hyperlipidemia)    SUBJECTIVE  Seen in nuclear medicine for lexiscan myoview. She tolerated the procedure well. No CP or SOB. No ST changes or arrhythmias noted. Otherwise feeling well.   CURRENT MEDS . apixaban  5 mg Oral BID  . aspirin EC  81 mg Oral Daily  . calcium-vitamin D  1 tablet Oral Q supper  . colestipol  1 g Oral BID  . diltiazem  120 mg Oral Daily  . multivitamin with minerals  1 tablet Oral Daily  . pantoprazole  40 mg Oral Daily    OBJECTIVE  Filed Vitals:   03/13/15 1555 03/13/15 2030 03/14/15 0500 03/14/15 0906  BP: 105/55 126/79 112/58 150/59  Pulse: 86 85 69 63  Temp: 97.9 F (36.6 C) 98.2 F (36.8 C) 98.3 F (36.8 C)   TempSrc: Oral Oral Oral   Resp:  18 18   Height:  (1.6 m)     Weight: 63.9 kg (140 lb 14 oz)     SpO2: 98% 94% 95%     Intake/Output Summary (Last 24 hours) at 03/14/15 0935 Last data filed at 03/13/15 2015  Gross per 24 hour  Intake    240 ml  Output      0 ml  Net    240 ml   Filed Weights   03/13/15 1018 03/13/15 1555  Weight: 64.864 kg (143 lb) 63.9 kg (140 lb 14 oz)    PHYSICAL EXAM  General: Pleasant, NAD. Frail and elderly appearing Neuro: Alert and oriented X 3. Moves all extremities spontaneously. Psych: Normal affect. HEENT:  Normal  Neck: Supple without bruits or JVD. Lungs:  Resp regular and unlabored, Basilar expiratory wheezing.  Heart: RRR no s3, s4, or murmurs. Abdomen: Soft, non-tender, non-distended, BS + x 4.  Extremities: No clubbing, cyanosis or edema. DP/PT/Radials 2+ and equal bilaterally.  Accessory Clinical Findings  CBC  Recent Labs  03/13/15 1115 03/14/15 0554  WBC 8.4 6.2  HGB 14.4 13.1  HCT 41.9 39.1  MCV 93.1 94.4  PLT 306 285   Basic Metabolic Panel  Recent Labs   16/10/96 1115  NA 141  K 4.2  CL 107  CO2 24  GLUCOSE 100*  BUN 8  CREATININE 0.66  CALCIUM 9.8   Cardiac Enzymes  Recent Labs  03/13/15 1708 03/14/15 0015 03/14/15 0554  TROPONINI <0.03 <0.03 <0.03   Thyroid Function Tests  Recent Labs  03/13/15 1115  TSH 2.402    TELE  NSR  Radiology/Studies  Dg Chest 2 View  03/13/2015   CLINICAL DATA:  New onset atrial fibrillation. Patient awoke with chest pain last night.  EXAM: CHEST  2 VIEW  COMPARISON:  None.  FINDINGS: Hyperinflation is present compatible with emphysema. Osteopenia. Mild thoracic kyphosis.  The cardiopericardial silhouette is within normal limits. No airspace disease. No effusion. Monitoring leads project over the chest.  IMPRESSION: No acute cardiopulmonary disease. Mild hyperinflation suggesting emphysema.   Electronically Signed   By: Andreas Newport M.D.   On: 03/13/2015 11:59    ASSESSMENT AND PLAN Gail Mcclure is a 81 y.o. female with a history of HTN and HL, former smoker (quit 53 years ago) who presented to [facility] ED on 03/13/15 for evaluation of weakness and fatigue and found to new  onset afib with RVR.  1. New onset atrial fibrillation - now converted into NSR ( saw in nuc med, not sure when she converted) - CHADVASc = 4 (Age 65, sex and HTN). - TSH normal, Echo pending - Unknown etiology and duration. Less likely of ischemic etiology. She woke up with a diaphoresis and then had left lateral dull achy pain, resolved in 1 minute.  - Continue Cardizem CD 120 and eliquis per pharmacy.   2. Atypical chest pain - Top X 4 negative. EKG without acute abnormality. Lexiscan today.  - Continue ASA  3. HTN - Stable. Home norvasc 10mg  held. Moderate control on Cardizem CD 120  4. HLD  - She was on simvastatin, recently placed on Colestipol 1g tablet due to diarrhea.    SignedJanetta Hora PA-C  Pager [phone]  I have examined the patient and reviewed assessment and plan and discussed  with patient.  Agree with above as stated.  Await stress test result.  No CP now.  Has walked without difficulty.   On Eliquis for stroke prevention.  Given age adn risks of cath as well as lack of current sx, would prefer medical management if no high risk stress test findings.  WOuld have to discuss with the son who is extremely involved and taking notes in the room. Echo pending as well.  Aldrick Derrig S.

## 2015-03-14 NOTE — Progress Notes (Signed)
  Echocardiogram 2D Echocardiogram has been performed.  Delcie Roch 03/14/2015, 4:01 PM

## 2015-03-14 NOTE — Progress Notes (Signed)
UR Completed Quincee Gittens Graves-Bigelow, RN,BSN [phone]  

## 2015-03-14 NOTE — Progress Notes (Deleted)
Patient Name: Gail Mcclure Date of Encounter: 03/14/2015  Principal Problem:   New onset atrial fibrillation (CHADVASc = 4) Active Problems:   HTN (hypertension)   Chest pain   HLD (hyperlipidemia)    SUBJECTIVE  Patient went for Lexiscan. Son at bed side informed that she slept well overnight and walked in hallway this AM without any difficulty.   CURRENT MEDS . apixaban  5 mg Oral BID  . aspirin EC  81 mg Oral Daily  . calcium-vitamin D  1 tablet Oral Q supper  . colestipol  1 g Oral BID  . diltiazem  120 mg Oral Daily  . multivitamin with minerals  1 tablet Oral Daily  . pantoprazole  40 mg Oral Daily    OBJECTIVE  Filed Vitals:   03/13/15 2030 03/14/15 0500 03/14/15 0906 03/14/15 0942  BP: 126/79 112/58 150/59 155/46  Pulse: 85 69 63 105  Temp: 98.2 F (36.8 C) 98.3 F (36.8 C)    TempSrc: Oral Oral    Resp: 18 18    Height:      Weight:      SpO2: 94% 95%      Intake/Output Summary (Last 24 hours) at 03/14/15 0942 Last data filed at 03/13/15 2015  Gross per 24 hour  Intake    240 ml  Output      0 ml  Net    240 ml   Filed Weights   03/13/15 1018 03/13/15 1555  Weight: 143 lb (64.864 kg) 140 lb 14 oz (63.9 kg)    PHYSICAL EXAM  General: Pleasant, NAD. Neuro: Alert and oriented X 3. Moves all extremities spontaneously. Psych: Normal affect. HEENT:  Normal  Neck: Supple without bruits or JVD. Lungs:  Resp regular and unlabored, CTA. Heart: RRR no s3, s4, or murmurs. Abdomen: Soft, non-tender, non-distended, BS + x 4.  Extremities: No clubbing, cyanosis or edema. DP/PT/Radials 2+ and equal bilaterally.  Accessory Clinical Findings  CBC  Recent Labs  03/13/15 1115 03/14/15 0554  WBC 8.4 6.2  HGB 14.4 13.1  HCT 41.9 39.1  MCV 93.1 94.4  PLT 306 285   Basic Metabolic Panel  Recent Labs  03/13/15 1115  NA 141  K 4.2  CL 107  CO2 24  GLUCOSE 100*  BUN 8  CREATININE 0.66  CALCIUM 9.8   Cardiac Enzymes  Recent Labs  03/13/15 1708 03/14/15 0015 03/14/15 0554  TROPONINI <0.03 <0.03 <0.03   Thyroid Function Tests  Recent Labs  03/13/15 1115  TSH 2.402    TELE  NSR  Radiology/Studies  Dg Chest 2 View  03/13/2015   CLINICAL DATA:  New onset atrial fibrillation. Patient awoke with chest pain last night.  EXAM: CHEST  2 VIEW  COMPARISON:  None.  FINDINGS: Hyperinflation is present compatible with emphysema. Osteopenia. Mild thoracic kyphosis.  The cardiopericardial silhouette is within normal limits. No airspace disease. No effusion. Monitoring leads project over the chest.  IMPRESSION: No acute cardiopulmonary disease. Mild hyperinflation suggesting emphysema.   Electronically Signed   By: Andreas Newport M.D.   On: 03/13/2015 11:59    ASSESSMENT AND PLAN  1. New onset atrial fibrillation  - CHADVASc = 4 (Age 12, sex and HTN). - Presented wit Afib RVR, rate improved on IV dilt, discontinued.  - TSH normal. Pending echo.  - Converted to Sinus rhythm around 1:40am. Will get EKG.  - Continue Cardizem CD 120 and eliquis 5mg  BID.   2. Atypical chest pain - Top  X 3 negative. Lexiscan today.  - Continue ASA  3. HTN - Stable. Held home dose of norvasc .   4. HLD  - She was on simvastatin, recently placed on Colestipol 1g tablet due to diarrhea.  - Continue Colestipol 1g.   Lorelei Pont PA-C Pager [phone]

## 2015-03-14 NOTE — Progress Notes (Signed)
    No ischemia by stress. Normal LV fuction by echo. No further cardiac testing needed.  COntinue with Eliquis and cardizem for Rx of AFib as HR tolerates.  F/u with Dr. Royann Shivers.  Corky Crafts, MD

## 2015-03-15 LAB — CBC
HCT: 38 % (ref 36.0–46.0)
Hemoglobin: 12.7 g/dL (ref 12.0–15.0)
MCH: 31.7 pg (ref 26.0–34.0)
MCHC: 33.4 g/dL (ref 30.0–36.0)
MCV: 94.8 fL (ref 78.0–100.0)
PLATELETS: 270 10*3/uL (ref 150–400)
RBC: 4.01 MIL/uL (ref 3.87–5.11)
RDW: 12.9 % (ref 11.5–15.5)
WBC: 6.2 10*3/uL (ref 4.0–10.5)

## 2015-03-15 MED ORDER — APIXABAN 5 MG PO TABS: 5.0000 mg | ORAL_TABLET | Freq: Two times a day (BID) | ORAL | Status: DC

## 2015-03-15 MED ORDER — DILTIAZEM HCL ER COATED BEADS 120 MG PO CP24: 120.0000 mg | ORAL_CAPSULE | Freq: Every day | ORAL | Status: DC

## 2015-03-15 NOTE — Progress Notes (Signed)
Pt discharged to home via [facility], condition stable, discharge instructions and educational materials given to patient and daughter, verbalized understanding, accompanied by family. Raymon Mutton RN

## 2015-03-15 NOTE — Discharge Instructions (Signed)

## 2015-03-15 NOTE — Discharge Summary (Signed)
Physician Discharge Summary  Gail Mcclure RUE:[id number] DOB: Jun 24, 1926 DOA: 03/13/2015  PCP: Desmond Dike, MD  Admit date: 03/13/2015 Discharge date: 03/15/2015  Time spent: 40 minutes  Recommendations for Outpatient Follow-up:  1. Follow-up with primary care physician within one week. 2. Follow-up with cardiology in 1-2 weeks.  Discharge Diagnoses:  Principal Problem:   New onset atrial fibrillation (CHADVASc = 4) Active Problems:   HTN (hypertension)   Chest pain   HLD (hyperlipidemia)   Discharge Condition: Stable  Diet recommendation: Heart healthy  Filed Weights   03/13/15 1018 03/13/15 1555 03/15/15 0500  Weight: 64.864 kg (143 lb) 63.9 kg (140 lb 14 oz) 63.458 kg (139 lb 14.4 oz)    History of present illness:  This is a very active 81 year old female patient with past medical history of hypertension and dyslipidemia as well as a remote history of tobacco abuse and found to have changes consistent with COPD on today's chest x-ray who presents with left lateral anterior chest pain as well as diaphoresis and weakness. Patient awakened around 2 AM with the above symptoms. She presented to an urgent care was found to be in atrial fibrillation so was sent to [facility] for further evaluation. Patient has noted over the past 2 weeks decrease in activity tolerance. Typically she walks 4 laps around [facility] daily for the past 2 weeks has decreased to lap secondary to shortness of breath. Over 2 week period she is also noticed increased focal left ankle swelling. He is not had anywhere as a palpitations, this morning with her symptoms she had left-sided chest discomfort and dizziness, diaphoresis and profound weakness. Of note this patient typically mows her 2 acre lawn on a riding lawnmower over the past 4 weeks because of excessive heat she had hired an individual to do this for her.  In the ER the patient was afebrile, pulse was anywhere between 96 and 122 bpm, rhythm was  atrial fibrillation, BP was 155/74. RA saturations were 96%. Patient was given a dose of IV Cardizem with subsequent decrease in heart rate and conversion to sinus rhythm. Laboratory data was unremarkable except for mildly elevated glucose of 100, initial troponin was less than 0.03, TSH was 2.4, chest x-ray revealed mild hyperinflation suggestive of emphysema.  Hospital Course:    New onset atrial fibrillation (CHADVASc = 4) -Presented with atrial fibrillation with rapid ventricular response. -Cardiology consulted -Started initially on full dose of IV heparin. -Started on IV Cardizem, reverted to sinus rhythm. -Cardiology recommended discharge on Cardizem CD 120 and Eliquis 5 mg twice a day   Chest pain -Likely related to RVR prior to admission -3 sets of cardiac enzymes are negative, stress test is low risk stress test. -Echocardiogram showed no wall motion abnormalities, LVEF of 63% and grade 1 diastolic dysfunction.   HTN  -Was on Norvasc prior to admission but have discontinued in favor of Cardizem for rate control -Current blood pressure controlled   HLD -On colestipol prior to admission   Early COPD -Based on recent x-ray noting patient asymptomatic -Remote history of tobacco abuse   Procedures:  2-D echo Study Conclusions  - Left ventricle: The cavity size was normal. Systolic function was normal. The estimated ejection fraction was in the range of 60% to 65%. Wall motion was normal; there were no regional wall motion abnormalities. Doppler parameters are consistent with abnormal left ventricular relaxation (grade 1 diastolic dysfunction).  Consultations:  Cardiology  Discharge Exam: Filed Vitals:   03/15/15 0500  BP:  141/56  Pulse: 69  Temp: 97.7 F (36.5 C)  Resp: 18   General: Alert and awake, oriented x3, not in any acute distress. HEENT: anicteric sclera, pupils reactive to light and accommodation, EOMI CVS: S1-S2 clear, no murmur  rubs or gallops Chest: clear to auscultation bilaterally, no wheezing, rales or rhonchi Abdomen: soft nontender, nondistended, normal bowel sounds, no organomegaly Extremities: no cyanosis, clubbing or edema noted bilaterally Neuro: Cranial nerves II-XII intact, no focal neurological deficits  Discharge Instructions   Discharge Instructions    Diet - low sodium heart healthy    Complete by:  As directed      Increase activity slowly    Complete by:  As directed           Current Discharge Medication List    START taking these medications   Details  apixaban (ELIQUIS) 5 MG TABS tablet Take 1 tablet (5 mg total) by mouth 2 (two) times daily. Qty: 60 tablet, Refills: 0    diltiazem (CARDIZEM CD) 120 MG 24 hr capsule Take 1 capsule (120 mg total) by mouth daily. Qty: 30 capsule, Refills: 0      CONTINUE these medications which have NOT CHANGED   Details  acetaminophen (TYLENOL) 500 MG tablet Take 1,000 mg by mouth every 6 (six) hours as needed for mild pain.    Calcium Carb-Cholecalciferol (CALCIUM 600/VITAMIN D3) 600-800 MG-UNIT TABS Take 1 tablet by mouth every evening.    colestipol (COLESTID) 1 G tablet Take 1 g by mouth 2 (two) times daily. Refills: 3    Multiple Vitamin (MULTIVITAMIN WITH MINERALS) TABS tablet Take 1 tablet by mouth daily.    omeprazole (PRILOSEC) 20 MG capsule Take 20 mg by mouth daily. Refills: 0      STOP taking these medications     amLODipine (NORVASC) 10 MG tablet      aspirin EC 81 MG tablet        No Known Allergies    The results of significant diagnostics from this hospitalization (including imaging, microbiology, ancillary and laboratory) are listed below for reference.    Significant Diagnostic Studies: Dg Chest 2 View  03/13/2015   CLINICAL DATA:  New onset atrial fibrillation. Patient awoke with chest pain last night.  EXAM: CHEST  2 VIEW  COMPARISON:  None.  FINDINGS: Hyperinflation is present compatible with emphysema.  Osteopenia. Mild thoracic kyphosis.  The cardiopericardial silhouette is within normal limits. No airspace disease. No effusion. Monitoring leads project over the chest.  IMPRESSION: No acute cardiopulmonary disease. Mild hyperinflation suggesting emphysema.   Electronically Signed   By: Andreas Newport M.D.   On: 03/13/2015 11:59   Nm Myocar Multi W/spect W/wall Motion / Ef  03/14/2015    There was no ST segment deviation noted during stress.  The study is normal.  This is a low risk study. No ischemia  Nuclear stress EF: 78%.     Microbiology: No results found for this or any previous visit (from the past 240 hour(s)).   Labs: Basic Metabolic Panel:  Recent Labs Lab 03/13/15 1115  NA 141  K 4.2  CL 107  CO2 24  GLUCOSE 100*  BUN 8  CREATININE 0.66  CALCIUM 9.8   Liver Function Tests: No results for input(s): AST, ALT, ALKPHOS, BILITOT, PROT, ALBUMIN in the last 168 hours. No results for input(s): LIPASE, AMYLASE in the last 168 hours. No results for input(s): AMMONIA in the last 168 hours. CBC:  Recent Labs Lab 03/13/15 1115  03/14/15 0554 03/15/15 0425  WBC 8.4 6.2 6.2  HGB 14.4 13.1 12.7  HCT 41.9 39.1 38.0  MCV 93.1 94.4 94.8  PLT 306 285 270   Cardiac Enzymes:  Recent Labs Lab 03/13/15 1115 03/13/15 1708 03/14/15 0015 03/14/15 0554  TROPONINI <0.03 <0.03 <0.03 <0.03   BNP: BNP (last 3 results) No results for input(s): BNP in the last 8760 hours.  ProBNP (last 3 results) No results for input(s): PROBNP in the last 8760 hours.  CBG: No results for input(s): GLUCAP in the last 168 hours.     Signed:  Deyona Soza A  [facility] 03/15/2015, 10:28 AM

## 2015-03-27 DIAGNOSIS — I10 Essential (primary) hypertension: Secondary | ICD-10-CM | POA: Diagnosis not present

## 2015-03-27 DIAGNOSIS — S161XXA Strain of muscle, fascia and tendon at neck level, initial encounter: Secondary | ICD-10-CM | POA: Diagnosis not present

## 2015-03-27 DIAGNOSIS — I4891 Unspecified atrial fibrillation: Secondary | ICD-10-CM | POA: Diagnosis not present

## 2015-03-27 DIAGNOSIS — E780 Pure hypercholesterolemia: Secondary | ICD-10-CM | POA: Diagnosis not present

## 2015-03-27 DIAGNOSIS — T148 Other injury of unspecified body region: Secondary | ICD-10-CM | POA: Diagnosis not present

## 2015-03-27 DIAGNOSIS — M542 Cervicalgia: Secondary | ICD-10-CM | POA: Diagnosis not present

## 2015-03-27 DIAGNOSIS — Z1389 Encounter for screening for other disorder: Secondary | ICD-10-CM | POA: Diagnosis not present

## 2015-05-08 DIAGNOSIS — Z1389 Encounter for screening for other disorder: Secondary | ICD-10-CM | POA: Diagnosis not present

## 2015-05-08 DIAGNOSIS — I4891 Unspecified atrial fibrillation: Secondary | ICD-10-CM | POA: Diagnosis not present

## 2015-06-03 DIAGNOSIS — R531 Weakness: Secondary | ICD-10-CM | POA: Diagnosis not present

## 2015-06-03 DIAGNOSIS — I4891 Unspecified atrial fibrillation: Secondary | ICD-10-CM | POA: Diagnosis not present

## 2015-06-03 DIAGNOSIS — R404 Transient alteration of awareness: Secondary | ICD-10-CM | POA: Diagnosis not present

## 2015-06-03 DIAGNOSIS — R0600 Dyspnea, unspecified: Secondary | ICD-10-CM | POA: Diagnosis not present

## 2015-07-07 DIAGNOSIS — Z9841 Cataract extraction status, right eye: Secondary | ICD-10-CM | POA: Diagnosis not present

## 2015-07-07 DIAGNOSIS — H35373 Puckering of macula, bilateral: Secondary | ICD-10-CM | POA: Diagnosis not present

## 2015-07-11 DIAGNOSIS — I10 Essential (primary) hypertension: Secondary | ICD-10-CM | POA: Diagnosis not present

## 2015-07-11 DIAGNOSIS — M7672 Peroneal tendinitis, left leg: Secondary | ICD-10-CM | POA: Diagnosis not present

## 2015-07-11 DIAGNOSIS — I4891 Unspecified atrial fibrillation: Secondary | ICD-10-CM | POA: Diagnosis not present

## 2015-09-22 DIAGNOSIS — J329 Chronic sinusitis, unspecified: Secondary | ICD-10-CM | POA: Diagnosis not present

## 2015-09-30 DIAGNOSIS — S8002XA Contusion of left knee, initial encounter: Secondary | ICD-10-CM | POA: Diagnosis not present

## 2015-09-30 DIAGNOSIS — M25521 Pain in right elbow: Secondary | ICD-10-CM | POA: Diagnosis not present

## 2015-09-30 DIAGNOSIS — M25569 Pain in unspecified knee: Secondary | ICD-10-CM | POA: Diagnosis not present

## 2015-09-30 DIAGNOSIS — T148 Other injury of unspecified body region: Secondary | ICD-10-CM | POA: Diagnosis not present

## 2015-09-30 DIAGNOSIS — R531 Weakness: Secondary | ICD-10-CM | POA: Diagnosis not present

## 2015-09-30 DIAGNOSIS — Z79899 Other long term (current) drug therapy: Secondary | ICD-10-CM | POA: Diagnosis not present

## 2015-09-30 DIAGNOSIS — S8991XA Unspecified injury of right lower leg, initial encounter: Secondary | ICD-10-CM | POA: Diagnosis not present

## 2015-09-30 DIAGNOSIS — S5001XA Contusion of right elbow, initial encounter: Secondary | ICD-10-CM | POA: Diagnosis not present

## 2015-09-30 DIAGNOSIS — I4891 Unspecified atrial fibrillation: Secondary | ICD-10-CM | POA: Diagnosis not present

## 2015-09-30 DIAGNOSIS — I10 Essential (primary) hypertension: Secondary | ICD-10-CM | POA: Diagnosis not present

## 2015-09-30 DIAGNOSIS — S8001XA Contusion of right knee, initial encounter: Secondary | ICD-10-CM | POA: Diagnosis not present

## 2015-09-30 DIAGNOSIS — E7800 Pure hypercholesterolemia, unspecified: Secondary | ICD-10-CM | POA: Diagnosis not present

## 2015-09-30 DIAGNOSIS — K219 Gastro-esophageal reflux disease without esophagitis: Secondary | ICD-10-CM | POA: Diagnosis not present

## 2015-09-30 DIAGNOSIS — M25562 Pain in left knee: Secondary | ICD-10-CM | POA: Diagnosis not present

## 2015-09-30 DIAGNOSIS — I951 Orthostatic hypotension: Secondary | ICD-10-CM | POA: Diagnosis not present

## 2015-09-30 DIAGNOSIS — Z7901 Long term (current) use of anticoagulants: Secondary | ICD-10-CM | POA: Diagnosis not present

## 2015-09-30 DIAGNOSIS — R55 Syncope and collapse: Secondary | ICD-10-CM | POA: Diagnosis not present

## 2015-10-07 DIAGNOSIS — M25529 Pain in unspecified elbow: Secondary | ICD-10-CM | POA: Diagnosis not present

## 2015-11-07 DIAGNOSIS — R112 Nausea with vomiting, unspecified: Secondary | ICD-10-CM | POA: Diagnosis not present

## 2015-11-07 DIAGNOSIS — R197 Diarrhea, unspecified: Secondary | ICD-10-CM | POA: Diagnosis not present

## 2015-11-07 DIAGNOSIS — R531 Weakness: Secondary | ICD-10-CM | POA: Diagnosis not present

## 2015-11-07 DIAGNOSIS — E860 Dehydration: Secondary | ICD-10-CM | POA: Diagnosis not present

## 2015-12-08 DIAGNOSIS — H6001 Abscess of right external ear: Secondary | ICD-10-CM | POA: Diagnosis not present

## 2016-01-01 DIAGNOSIS — Z1231 Encounter for screening mammogram for malignant neoplasm of breast: Secondary | ICD-10-CM | POA: Diagnosis not present

## 2016-01-20 DIAGNOSIS — L0100 Impetigo, unspecified: Secondary | ICD-10-CM | POA: Diagnosis not present

## 2016-02-18 DIAGNOSIS — E785 Hyperlipidemia, unspecified: Secondary | ICD-10-CM | POA: Diagnosis not present

## 2016-02-18 DIAGNOSIS — S299XXA Unspecified injury of thorax, initial encounter: Secondary | ICD-10-CM | POA: Diagnosis not present

## 2016-02-18 DIAGNOSIS — Z96642 Presence of left artificial hip joint: Secondary | ICD-10-CM | POA: Diagnosis not present

## 2016-02-18 DIAGNOSIS — S72042A Displaced fracture of base of neck of left femur, initial encounter for closed fracture: Secondary | ICD-10-CM | POA: Diagnosis not present

## 2016-02-18 DIAGNOSIS — I471 Supraventricular tachycardia: Secondary | ICD-10-CM | POA: Diagnosis not present

## 2016-02-18 DIAGNOSIS — D649 Anemia, unspecified: Secondary | ICD-10-CM | POA: Diagnosis not present

## 2016-02-18 DIAGNOSIS — S72002A Fracture of unspecified part of neck of left femur, initial encounter for closed fracture: Secondary | ICD-10-CM | POA: Diagnosis not present

## 2016-02-18 DIAGNOSIS — I480 Paroxysmal atrial fibrillation: Secondary | ICD-10-CM | POA: Diagnosis not present

## 2016-02-18 DIAGNOSIS — M84652A Pathological fracture in other disease, left femur, initial encounter for fracture: Secondary | ICD-10-CM | POA: Diagnosis not present

## 2016-02-18 DIAGNOSIS — S0990XA Unspecified injury of head, initial encounter: Secondary | ICD-10-CM | POA: Diagnosis not present

## 2016-02-18 DIAGNOSIS — Z471 Aftercare following joint replacement surgery: Secondary | ICD-10-CM | POA: Diagnosis not present

## 2016-02-18 DIAGNOSIS — N3000 Acute cystitis without hematuria: Secondary | ICD-10-CM | POA: Diagnosis not present

## 2016-02-18 DIAGNOSIS — I4891 Unspecified atrial fibrillation: Secondary | ICD-10-CM | POA: Diagnosis not present

## 2016-02-18 DIAGNOSIS — R52 Pain, unspecified: Secondary | ICD-10-CM | POA: Diagnosis not present

## 2016-02-18 DIAGNOSIS — R29898 Other symptoms and signs involving the musculoskeletal system: Secondary | ICD-10-CM | POA: Diagnosis not present

## 2016-02-18 DIAGNOSIS — Z7401 Bed confinement status: Secondary | ICD-10-CM | POA: Diagnosis not present

## 2016-02-18 DIAGNOSIS — M25552 Pain in left hip: Secondary | ICD-10-CM | POA: Diagnosis not present

## 2016-02-18 DIAGNOSIS — T148 Other injury of unspecified body region: Secondary | ICD-10-CM | POA: Diagnosis not present

## 2016-02-18 DIAGNOSIS — S72092A Other fracture of head and neck of left femur, initial encounter for closed fracture: Secondary | ICD-10-CM | POA: Diagnosis not present

## 2016-02-18 DIAGNOSIS — W19XXXD Unspecified fall, subsequent encounter: Secondary | ICD-10-CM | POA: Diagnosis not present

## 2016-02-18 DIAGNOSIS — M1612 Unilateral primary osteoarthritis, left hip: Secondary | ICD-10-CM | POA: Diagnosis not present

## 2016-02-18 DIAGNOSIS — Z7901 Long term (current) use of anticoagulants: Secondary | ICD-10-CM | POA: Diagnosis not present

## 2016-02-18 DIAGNOSIS — S199XXA Unspecified injury of neck, initial encounter: Secondary | ICD-10-CM | POA: Diagnosis not present

## 2016-02-18 DIAGNOSIS — I10 Essential (primary) hypertension: Secondary | ICD-10-CM | POA: Diagnosis not present

## 2016-02-18 DIAGNOSIS — R269 Unspecified abnormalities of gait and mobility: Secondary | ICD-10-CM | POA: Diagnosis not present

## 2016-02-18 DIAGNOSIS — N390 Urinary tract infection, site not specified: Secondary | ICD-10-CM | POA: Diagnosis not present

## 2016-02-18 DIAGNOSIS — Z96653 Presence of artificial knee joint, bilateral: Secondary | ICD-10-CM | POA: Diagnosis not present

## 2016-02-18 DIAGNOSIS — S72002D Fracture of unspecified part of neck of left femur, subsequent encounter for closed fracture with routine healing: Secondary | ICD-10-CM | POA: Diagnosis not present

## 2016-02-22 DIAGNOSIS — R262 Difficulty in walking, not elsewhere classified: Secondary | ICD-10-CM | POA: Diagnosis not present

## 2016-02-22 DIAGNOSIS — I4891 Unspecified atrial fibrillation: Secondary | ICD-10-CM | POA: Diagnosis not present

## 2016-02-22 DIAGNOSIS — D649 Anemia, unspecified: Secondary | ICD-10-CM | POA: Diagnosis not present

## 2016-02-22 DIAGNOSIS — G8918 Other acute postprocedural pain: Secondary | ICD-10-CM | POA: Diagnosis not present

## 2016-02-22 DIAGNOSIS — Z96649 Presence of unspecified artificial hip joint: Secondary | ICD-10-CM | POA: Diagnosis not present

## 2016-02-22 DIAGNOSIS — E785 Hyperlipidemia, unspecified: Secondary | ICD-10-CM | POA: Diagnosis not present

## 2016-02-22 DIAGNOSIS — W19XXXD Unspecified fall, subsequent encounter: Secondary | ICD-10-CM | POA: Diagnosis not present

## 2016-02-22 DIAGNOSIS — N390 Urinary tract infection, site not specified: Secondary | ICD-10-CM | POA: Diagnosis not present

## 2016-02-22 DIAGNOSIS — I10 Essential (primary) hypertension: Secondary | ICD-10-CM | POA: Diagnosis not present

## 2016-02-22 DIAGNOSIS — R29898 Other symptoms and signs involving the musculoskeletal system: Secondary | ICD-10-CM | POA: Diagnosis not present

## 2016-02-22 DIAGNOSIS — Z7401 Bed confinement status: Secondary | ICD-10-CM | POA: Diagnosis not present

## 2016-02-22 DIAGNOSIS — S72002D Fracture of unspecified part of neck of left femur, subsequent encounter for closed fracture with routine healing: Secondary | ICD-10-CM | POA: Diagnosis not present

## 2016-02-23 DIAGNOSIS — R262 Difficulty in walking, not elsewhere classified: Secondary | ICD-10-CM | POA: Diagnosis not present

## 2016-02-23 DIAGNOSIS — G8918 Other acute postprocedural pain: Secondary | ICD-10-CM | POA: Diagnosis not present

## 2016-02-23 DIAGNOSIS — D649 Anemia, unspecified: Secondary | ICD-10-CM | POA: Diagnosis not present

## 2016-02-23 DIAGNOSIS — S72002D Fracture of unspecified part of neck of left femur, subsequent encounter for closed fracture with routine healing: Secondary | ICD-10-CM | POA: Diagnosis not present

## 2016-03-05 DIAGNOSIS — Z96649 Presence of unspecified artificial hip joint: Secondary | ICD-10-CM | POA: Diagnosis not present

## 2016-03-15 DIAGNOSIS — M1991 Primary osteoarthritis, unspecified site: Secondary | ICD-10-CM | POA: Diagnosis not present

## 2016-03-15 DIAGNOSIS — Z7901 Long term (current) use of anticoagulants: Secondary | ICD-10-CM | POA: Diagnosis not present

## 2016-03-15 DIAGNOSIS — I10 Essential (primary) hypertension: Secondary | ICD-10-CM | POA: Diagnosis not present

## 2016-03-15 DIAGNOSIS — Z9181 History of falling: Secondary | ICD-10-CM | POA: Diagnosis not present

## 2016-03-15 DIAGNOSIS — I480 Paroxysmal atrial fibrillation: Secondary | ICD-10-CM | POA: Diagnosis not present

## 2016-03-15 DIAGNOSIS — S72002D Fracture of unspecified part of neck of left femur, subsequent encounter for closed fracture with routine healing: Secondary | ICD-10-CM | POA: Diagnosis not present

## 2016-03-15 DIAGNOSIS — Z87440 Personal history of urinary (tract) infections: Secondary | ICD-10-CM | POA: Diagnosis not present

## 2016-03-15 DIAGNOSIS — W19XXXD Unspecified fall, subsequent encounter: Secondary | ICD-10-CM | POA: Diagnosis not present

## 2016-03-15 DIAGNOSIS — M810 Age-related osteoporosis without current pathological fracture: Secondary | ICD-10-CM | POA: Diagnosis not present

## 2016-03-15 DIAGNOSIS — Z79891 Long term (current) use of opiate analgesic: Secondary | ICD-10-CM | POA: Diagnosis not present

## 2016-03-15 DIAGNOSIS — Z96652 Presence of left artificial knee joint: Secondary | ICD-10-CM | POA: Diagnosis not present

## 2016-03-15 DIAGNOSIS — S80922D Unspecified superficial injury of left lower leg, subsequent encounter: Secondary | ICD-10-CM | POA: Diagnosis not present

## 2016-03-17 DIAGNOSIS — Z87440 Personal history of urinary (tract) infections: Secondary | ICD-10-CM | POA: Diagnosis not present

## 2016-03-17 DIAGNOSIS — M1991 Primary osteoarthritis, unspecified site: Secondary | ICD-10-CM | POA: Diagnosis not present

## 2016-03-17 DIAGNOSIS — W19XXXD Unspecified fall, subsequent encounter: Secondary | ICD-10-CM | POA: Diagnosis not present

## 2016-03-17 DIAGNOSIS — Z7901 Long term (current) use of anticoagulants: Secondary | ICD-10-CM | POA: Diagnosis not present

## 2016-03-17 DIAGNOSIS — I10 Essential (primary) hypertension: Secondary | ICD-10-CM | POA: Diagnosis not present

## 2016-03-17 DIAGNOSIS — I480 Paroxysmal atrial fibrillation: Secondary | ICD-10-CM | POA: Diagnosis not present

## 2016-03-17 DIAGNOSIS — S80922D Unspecified superficial injury of left lower leg, subsequent encounter: Secondary | ICD-10-CM | POA: Diagnosis not present

## 2016-03-17 DIAGNOSIS — Z9181 History of falling: Secondary | ICD-10-CM | POA: Diagnosis not present

## 2016-03-17 DIAGNOSIS — Z96652 Presence of left artificial knee joint: Secondary | ICD-10-CM | POA: Diagnosis not present

## 2016-03-17 DIAGNOSIS — Z79891 Long term (current) use of opiate analgesic: Secondary | ICD-10-CM | POA: Diagnosis not present

## 2016-03-17 DIAGNOSIS — S72002D Fracture of unspecified part of neck of left femur, subsequent encounter for closed fracture with routine healing: Secondary | ICD-10-CM | POA: Diagnosis not present

## 2016-03-17 DIAGNOSIS — M810 Age-related osteoporosis without current pathological fracture: Secondary | ICD-10-CM | POA: Diagnosis not present

## 2016-03-18 DIAGNOSIS — I480 Paroxysmal atrial fibrillation: Secondary | ICD-10-CM | POA: Diagnosis not present

## 2016-03-18 DIAGNOSIS — W19XXXD Unspecified fall, subsequent encounter: Secondary | ICD-10-CM | POA: Diagnosis not present

## 2016-03-18 DIAGNOSIS — Z87440 Personal history of urinary (tract) infections: Secondary | ICD-10-CM | POA: Diagnosis not present

## 2016-03-18 DIAGNOSIS — M810 Age-related osteoporosis without current pathological fracture: Secondary | ICD-10-CM | POA: Diagnosis not present

## 2016-03-18 DIAGNOSIS — M1991 Primary osteoarthritis, unspecified site: Secondary | ICD-10-CM | POA: Diagnosis not present

## 2016-03-18 DIAGNOSIS — Z9181 History of falling: Secondary | ICD-10-CM | POA: Diagnosis not present

## 2016-03-18 DIAGNOSIS — Z79891 Long term (current) use of opiate analgesic: Secondary | ICD-10-CM | POA: Diagnosis not present

## 2016-03-18 DIAGNOSIS — I10 Essential (primary) hypertension: Secondary | ICD-10-CM | POA: Diagnosis not present

## 2016-03-18 DIAGNOSIS — Z96652 Presence of left artificial knee joint: Secondary | ICD-10-CM | POA: Diagnosis not present

## 2016-03-18 DIAGNOSIS — S80922D Unspecified superficial injury of left lower leg, subsequent encounter: Secondary | ICD-10-CM | POA: Diagnosis not present

## 2016-03-18 DIAGNOSIS — Z7901 Long term (current) use of anticoagulants: Secondary | ICD-10-CM | POA: Diagnosis not present

## 2016-03-18 DIAGNOSIS — S72002D Fracture of unspecified part of neck of left femur, subsequent encounter for closed fracture with routine healing: Secondary | ICD-10-CM | POA: Diagnosis not present

## 2016-03-19 DIAGNOSIS — Z96652 Presence of left artificial knee joint: Secondary | ICD-10-CM | POA: Diagnosis not present

## 2016-03-19 DIAGNOSIS — Z7901 Long term (current) use of anticoagulants: Secondary | ICD-10-CM | POA: Diagnosis not present

## 2016-03-19 DIAGNOSIS — S80922D Unspecified superficial injury of left lower leg, subsequent encounter: Secondary | ICD-10-CM | POA: Diagnosis not present

## 2016-03-19 DIAGNOSIS — M810 Age-related osteoporosis without current pathological fracture: Secondary | ICD-10-CM | POA: Diagnosis not present

## 2016-03-19 DIAGNOSIS — I10 Essential (primary) hypertension: Secondary | ICD-10-CM | POA: Diagnosis not present

## 2016-03-19 DIAGNOSIS — Z9181 History of falling: Secondary | ICD-10-CM | POA: Diagnosis not present

## 2016-03-19 DIAGNOSIS — W19XXXD Unspecified fall, subsequent encounter: Secondary | ICD-10-CM | POA: Diagnosis not present

## 2016-03-19 DIAGNOSIS — Z79891 Long term (current) use of opiate analgesic: Secondary | ICD-10-CM | POA: Diagnosis not present

## 2016-03-19 DIAGNOSIS — M1991 Primary osteoarthritis, unspecified site: Secondary | ICD-10-CM | POA: Diagnosis not present

## 2016-03-19 DIAGNOSIS — Z87440 Personal history of urinary (tract) infections: Secondary | ICD-10-CM | POA: Diagnosis not present

## 2016-03-19 DIAGNOSIS — S72002D Fracture of unspecified part of neck of left femur, subsequent encounter for closed fracture with routine healing: Secondary | ICD-10-CM | POA: Diagnosis not present

## 2016-03-19 DIAGNOSIS — I480 Paroxysmal atrial fibrillation: Secondary | ICD-10-CM | POA: Diagnosis not present

## 2016-03-22 DIAGNOSIS — Z9181 History of falling: Secondary | ICD-10-CM | POA: Diagnosis not present

## 2016-03-22 DIAGNOSIS — R350 Frequency of micturition: Secondary | ICD-10-CM | POA: Diagnosis not present

## 2016-03-22 DIAGNOSIS — Z8781 Personal history of (healed) traumatic fracture: Secondary | ICD-10-CM | POA: Diagnosis not present

## 2016-03-22 DIAGNOSIS — Z7901 Long term (current) use of anticoagulants: Secondary | ICD-10-CM | POA: Diagnosis not present

## 2016-03-22 DIAGNOSIS — Z96652 Presence of left artificial knee joint: Secondary | ICD-10-CM | POA: Diagnosis not present

## 2016-03-22 DIAGNOSIS — S72002D Fracture of unspecified part of neck of left femur, subsequent encounter for closed fracture with routine healing: Secondary | ICD-10-CM | POA: Diagnosis not present

## 2016-03-22 DIAGNOSIS — M1991 Primary osteoarthritis, unspecified site: Secondary | ICD-10-CM | POA: Diagnosis not present

## 2016-03-22 DIAGNOSIS — Z87440 Personal history of urinary (tract) infections: Secondary | ICD-10-CM | POA: Diagnosis not present

## 2016-03-22 DIAGNOSIS — Z79891 Long term (current) use of opiate analgesic: Secondary | ICD-10-CM | POA: Diagnosis not present

## 2016-03-22 DIAGNOSIS — S80922D Unspecified superficial injury of left lower leg, subsequent encounter: Secondary | ICD-10-CM | POA: Diagnosis not present

## 2016-03-22 DIAGNOSIS — M810 Age-related osteoporosis without current pathological fracture: Secondary | ICD-10-CM | POA: Diagnosis not present

## 2016-03-22 DIAGNOSIS — I480 Paroxysmal atrial fibrillation: Secondary | ICD-10-CM | POA: Diagnosis not present

## 2016-03-22 DIAGNOSIS — I10 Essential (primary) hypertension: Secondary | ICD-10-CM | POA: Diagnosis not present

## 2016-03-22 DIAGNOSIS — W19XXXD Unspecified fall, subsequent encounter: Secondary | ICD-10-CM | POA: Diagnosis not present

## 2016-03-23 DIAGNOSIS — I10 Essential (primary) hypertension: Secondary | ICD-10-CM | POA: Diagnosis not present

## 2016-03-23 DIAGNOSIS — Z9181 History of falling: Secondary | ICD-10-CM | POA: Diagnosis not present

## 2016-03-23 DIAGNOSIS — I480 Paroxysmal atrial fibrillation: Secondary | ICD-10-CM | POA: Diagnosis not present

## 2016-03-23 DIAGNOSIS — W19XXXD Unspecified fall, subsequent encounter: Secondary | ICD-10-CM | POA: Diagnosis not present

## 2016-03-23 DIAGNOSIS — S80922D Unspecified superficial injury of left lower leg, subsequent encounter: Secondary | ICD-10-CM | POA: Diagnosis not present

## 2016-03-23 DIAGNOSIS — Z87440 Personal history of urinary (tract) infections: Secondary | ICD-10-CM | POA: Diagnosis not present

## 2016-03-23 DIAGNOSIS — Z7901 Long term (current) use of anticoagulants: Secondary | ICD-10-CM | POA: Diagnosis not present

## 2016-03-23 DIAGNOSIS — Z79891 Long term (current) use of opiate analgesic: Secondary | ICD-10-CM | POA: Diagnosis not present

## 2016-03-23 DIAGNOSIS — Z96652 Presence of left artificial knee joint: Secondary | ICD-10-CM | POA: Diagnosis not present

## 2016-03-23 DIAGNOSIS — S72002D Fracture of unspecified part of neck of left femur, subsequent encounter for closed fracture with routine healing: Secondary | ICD-10-CM | POA: Diagnosis not present

## 2016-03-23 DIAGNOSIS — M1991 Primary osteoarthritis, unspecified site: Secondary | ICD-10-CM | POA: Diagnosis not present

## 2016-03-23 DIAGNOSIS — M810 Age-related osteoporosis without current pathological fracture: Secondary | ICD-10-CM | POA: Diagnosis not present

## 2016-03-24 DIAGNOSIS — S80922D Unspecified superficial injury of left lower leg, subsequent encounter: Secondary | ICD-10-CM | POA: Diagnosis not present

## 2016-03-24 DIAGNOSIS — W19XXXD Unspecified fall, subsequent encounter: Secondary | ICD-10-CM | POA: Diagnosis not present

## 2016-03-24 DIAGNOSIS — M1991 Primary osteoarthritis, unspecified site: Secondary | ICD-10-CM | POA: Diagnosis not present

## 2016-03-24 DIAGNOSIS — Z96652 Presence of left artificial knee joint: Secondary | ICD-10-CM | POA: Diagnosis not present

## 2016-03-24 DIAGNOSIS — Z79891 Long term (current) use of opiate analgesic: Secondary | ICD-10-CM | POA: Diagnosis not present

## 2016-03-24 DIAGNOSIS — S72002D Fracture of unspecified part of neck of left femur, subsequent encounter for closed fracture with routine healing: Secondary | ICD-10-CM | POA: Diagnosis not present

## 2016-03-24 DIAGNOSIS — Z7901 Long term (current) use of anticoagulants: Secondary | ICD-10-CM | POA: Diagnosis not present

## 2016-03-24 DIAGNOSIS — I480 Paroxysmal atrial fibrillation: Secondary | ICD-10-CM | POA: Diagnosis not present

## 2016-03-24 DIAGNOSIS — M810 Age-related osteoporosis without current pathological fracture: Secondary | ICD-10-CM | POA: Diagnosis not present

## 2016-03-24 DIAGNOSIS — Z9181 History of falling: Secondary | ICD-10-CM | POA: Diagnosis not present

## 2016-03-24 DIAGNOSIS — Z87440 Personal history of urinary (tract) infections: Secondary | ICD-10-CM | POA: Diagnosis not present

## 2016-03-24 DIAGNOSIS — I10 Essential (primary) hypertension: Secondary | ICD-10-CM | POA: Diagnosis not present

## 2016-03-25 DIAGNOSIS — I10 Essential (primary) hypertension: Secondary | ICD-10-CM | POA: Diagnosis not present

## 2016-03-25 DIAGNOSIS — Z79891 Long term (current) use of opiate analgesic: Secondary | ICD-10-CM | POA: Diagnosis not present

## 2016-03-25 DIAGNOSIS — Z96652 Presence of left artificial knee joint: Secondary | ICD-10-CM | POA: Diagnosis not present

## 2016-03-25 DIAGNOSIS — Z9181 History of falling: Secondary | ICD-10-CM | POA: Diagnosis not present

## 2016-03-25 DIAGNOSIS — S80922D Unspecified superficial injury of left lower leg, subsequent encounter: Secondary | ICD-10-CM | POA: Diagnosis not present

## 2016-03-25 DIAGNOSIS — Z87440 Personal history of urinary (tract) infections: Secondary | ICD-10-CM | POA: Diagnosis not present

## 2016-03-25 DIAGNOSIS — Z7901 Long term (current) use of anticoagulants: Secondary | ICD-10-CM | POA: Diagnosis not present

## 2016-03-25 DIAGNOSIS — M810 Age-related osteoporosis without current pathological fracture: Secondary | ICD-10-CM | POA: Diagnosis not present

## 2016-03-25 DIAGNOSIS — M1991 Primary osteoarthritis, unspecified site: Secondary | ICD-10-CM | POA: Diagnosis not present

## 2016-03-25 DIAGNOSIS — I480 Paroxysmal atrial fibrillation: Secondary | ICD-10-CM | POA: Diagnosis not present

## 2016-03-25 DIAGNOSIS — S72002D Fracture of unspecified part of neck of left femur, subsequent encounter for closed fracture with routine healing: Secondary | ICD-10-CM | POA: Diagnosis not present

## 2016-03-25 DIAGNOSIS — W19XXXD Unspecified fall, subsequent encounter: Secondary | ICD-10-CM | POA: Diagnosis not present

## 2016-03-26 DIAGNOSIS — S80922D Unspecified superficial injury of left lower leg, subsequent encounter: Secondary | ICD-10-CM | POA: Diagnosis not present

## 2016-03-26 DIAGNOSIS — Z79891 Long term (current) use of opiate analgesic: Secondary | ICD-10-CM | POA: Diagnosis not present

## 2016-03-26 DIAGNOSIS — I480 Paroxysmal atrial fibrillation: Secondary | ICD-10-CM | POA: Diagnosis not present

## 2016-03-26 DIAGNOSIS — Z9181 History of falling: Secondary | ICD-10-CM | POA: Diagnosis not present

## 2016-03-26 DIAGNOSIS — M1991 Primary osteoarthritis, unspecified site: Secondary | ICD-10-CM | POA: Diagnosis not present

## 2016-03-26 DIAGNOSIS — Z87440 Personal history of urinary (tract) infections: Secondary | ICD-10-CM | POA: Diagnosis not present

## 2016-03-26 DIAGNOSIS — Z96652 Presence of left artificial knee joint: Secondary | ICD-10-CM | POA: Diagnosis not present

## 2016-03-26 DIAGNOSIS — S72002D Fracture of unspecified part of neck of left femur, subsequent encounter for closed fracture with routine healing: Secondary | ICD-10-CM | POA: Diagnosis not present

## 2016-03-26 DIAGNOSIS — Z7901 Long term (current) use of anticoagulants: Secondary | ICD-10-CM | POA: Diagnosis not present

## 2016-03-26 DIAGNOSIS — M810 Age-related osteoporosis without current pathological fracture: Secondary | ICD-10-CM | POA: Diagnosis not present

## 2016-03-26 DIAGNOSIS — I10 Essential (primary) hypertension: Secondary | ICD-10-CM | POA: Diagnosis not present

## 2016-03-26 DIAGNOSIS — W19XXXD Unspecified fall, subsequent encounter: Secondary | ICD-10-CM | POA: Diagnosis not present

## 2016-03-30 DIAGNOSIS — Z87440 Personal history of urinary (tract) infections: Secondary | ICD-10-CM | POA: Diagnosis not present

## 2016-03-30 DIAGNOSIS — S80922D Unspecified superficial injury of left lower leg, subsequent encounter: Secondary | ICD-10-CM | POA: Diagnosis not present

## 2016-03-30 DIAGNOSIS — Z9181 History of falling: Secondary | ICD-10-CM | POA: Diagnosis not present

## 2016-03-30 DIAGNOSIS — Z79891 Long term (current) use of opiate analgesic: Secondary | ICD-10-CM | POA: Diagnosis not present

## 2016-03-30 DIAGNOSIS — S72002D Fracture of unspecified part of neck of left femur, subsequent encounter for closed fracture with routine healing: Secondary | ICD-10-CM | POA: Diagnosis not present

## 2016-03-30 DIAGNOSIS — M810 Age-related osteoporosis without current pathological fracture: Secondary | ICD-10-CM | POA: Diagnosis not present

## 2016-03-30 DIAGNOSIS — I10 Essential (primary) hypertension: Secondary | ICD-10-CM | POA: Diagnosis not present

## 2016-03-30 DIAGNOSIS — I480 Paroxysmal atrial fibrillation: Secondary | ICD-10-CM | POA: Diagnosis not present

## 2016-03-30 DIAGNOSIS — Z7901 Long term (current) use of anticoagulants: Secondary | ICD-10-CM | POA: Diagnosis not present

## 2016-03-30 DIAGNOSIS — Z96652 Presence of left artificial knee joint: Secondary | ICD-10-CM | POA: Diagnosis not present

## 2016-03-30 DIAGNOSIS — M1991 Primary osteoarthritis, unspecified site: Secondary | ICD-10-CM | POA: Diagnosis not present

## 2016-03-30 DIAGNOSIS — W19XXXD Unspecified fall, subsequent encounter: Secondary | ICD-10-CM | POA: Diagnosis not present

## 2016-04-01 DIAGNOSIS — Z7901 Long term (current) use of anticoagulants: Secondary | ICD-10-CM | POA: Diagnosis not present

## 2016-04-01 DIAGNOSIS — S80922D Unspecified superficial injury of left lower leg, subsequent encounter: Secondary | ICD-10-CM | POA: Diagnosis not present

## 2016-04-01 DIAGNOSIS — Z87440 Personal history of urinary (tract) infections: Secondary | ICD-10-CM | POA: Diagnosis not present

## 2016-04-01 DIAGNOSIS — I10 Essential (primary) hypertension: Secondary | ICD-10-CM | POA: Diagnosis not present

## 2016-04-01 DIAGNOSIS — S72002D Fracture of unspecified part of neck of left femur, subsequent encounter for closed fracture with routine healing: Secondary | ICD-10-CM | POA: Diagnosis not present

## 2016-04-01 DIAGNOSIS — W19XXXD Unspecified fall, subsequent encounter: Secondary | ICD-10-CM | POA: Diagnosis not present

## 2016-04-01 DIAGNOSIS — Z9181 History of falling: Secondary | ICD-10-CM | POA: Diagnosis not present

## 2016-04-01 DIAGNOSIS — Z79891 Long term (current) use of opiate analgesic: Secondary | ICD-10-CM | POA: Diagnosis not present

## 2016-04-01 DIAGNOSIS — M1991 Primary osteoarthritis, unspecified site: Secondary | ICD-10-CM | POA: Diagnosis not present

## 2016-04-01 DIAGNOSIS — Z96652 Presence of left artificial knee joint: Secondary | ICD-10-CM | POA: Diagnosis not present

## 2016-04-01 DIAGNOSIS — M810 Age-related osteoporosis without current pathological fracture: Secondary | ICD-10-CM | POA: Diagnosis not present

## 2016-04-01 DIAGNOSIS — I480 Paroxysmal atrial fibrillation: Secondary | ICD-10-CM | POA: Diagnosis not present

## 2016-04-02 DIAGNOSIS — Z96649 Presence of unspecified artificial hip joint: Secondary | ICD-10-CM | POA: Diagnosis not present

## 2016-04-06 DIAGNOSIS — I480 Paroxysmal atrial fibrillation: Secondary | ICD-10-CM | POA: Diagnosis not present

## 2016-04-06 DIAGNOSIS — Z79891 Long term (current) use of opiate analgesic: Secondary | ICD-10-CM | POA: Diagnosis not present

## 2016-04-06 DIAGNOSIS — I10 Essential (primary) hypertension: Secondary | ICD-10-CM | POA: Diagnosis not present

## 2016-04-06 DIAGNOSIS — S72002D Fracture of unspecified part of neck of left femur, subsequent encounter for closed fracture with routine healing: Secondary | ICD-10-CM | POA: Diagnosis not present

## 2016-04-06 DIAGNOSIS — S80922D Unspecified superficial injury of left lower leg, subsequent encounter: Secondary | ICD-10-CM | POA: Diagnosis not present

## 2016-04-06 DIAGNOSIS — W19XXXD Unspecified fall, subsequent encounter: Secondary | ICD-10-CM | POA: Diagnosis not present

## 2016-04-06 DIAGNOSIS — M1991 Primary osteoarthritis, unspecified site: Secondary | ICD-10-CM | POA: Diagnosis not present

## 2016-04-06 DIAGNOSIS — Z87440 Personal history of urinary (tract) infections: Secondary | ICD-10-CM | POA: Diagnosis not present

## 2016-04-06 DIAGNOSIS — M810 Age-related osteoporosis without current pathological fracture: Secondary | ICD-10-CM | POA: Diagnosis not present

## 2016-04-06 DIAGNOSIS — Z9181 History of falling: Secondary | ICD-10-CM | POA: Diagnosis not present

## 2016-04-06 DIAGNOSIS — Z96652 Presence of left artificial knee joint: Secondary | ICD-10-CM | POA: Diagnosis not present

## 2016-04-06 DIAGNOSIS — Z7901 Long term (current) use of anticoagulants: Secondary | ICD-10-CM | POA: Diagnosis not present

## 2016-04-09 DIAGNOSIS — Z87440 Personal history of urinary (tract) infections: Secondary | ICD-10-CM | POA: Diagnosis not present

## 2016-04-09 DIAGNOSIS — W19XXXD Unspecified fall, subsequent encounter: Secondary | ICD-10-CM | POA: Diagnosis not present

## 2016-04-09 DIAGNOSIS — M1991 Primary osteoarthritis, unspecified site: Secondary | ICD-10-CM | POA: Diagnosis not present

## 2016-04-09 DIAGNOSIS — Z79891 Long term (current) use of opiate analgesic: Secondary | ICD-10-CM | POA: Diagnosis not present

## 2016-04-09 DIAGNOSIS — Z96652 Presence of left artificial knee joint: Secondary | ICD-10-CM | POA: Diagnosis not present

## 2016-04-09 DIAGNOSIS — S80922D Unspecified superficial injury of left lower leg, subsequent encounter: Secondary | ICD-10-CM | POA: Diagnosis not present

## 2016-04-09 DIAGNOSIS — S72002D Fracture of unspecified part of neck of left femur, subsequent encounter for closed fracture with routine healing: Secondary | ICD-10-CM | POA: Diagnosis not present

## 2016-04-09 DIAGNOSIS — Z7901 Long term (current) use of anticoagulants: Secondary | ICD-10-CM | POA: Diagnosis not present

## 2016-04-09 DIAGNOSIS — Z9181 History of falling: Secondary | ICD-10-CM | POA: Diagnosis not present

## 2016-04-09 DIAGNOSIS — I10 Essential (primary) hypertension: Secondary | ICD-10-CM | POA: Diagnosis not present

## 2016-04-09 DIAGNOSIS — M810 Age-related osteoporosis without current pathological fracture: Secondary | ICD-10-CM | POA: Diagnosis not present

## 2016-04-09 DIAGNOSIS — I480 Paroxysmal atrial fibrillation: Secondary | ICD-10-CM | POA: Diagnosis not present

## 2016-04-12 DIAGNOSIS — M25552 Pain in left hip: Secondary | ICD-10-CM | POA: Diagnosis not present

## 2016-04-12 DIAGNOSIS — R2689 Other abnormalities of gait and mobility: Secondary | ICD-10-CM | POA: Diagnosis not present

## 2016-04-12 DIAGNOSIS — M6281 Muscle weakness (generalized): Secondary | ICD-10-CM | POA: Diagnosis not present

## 2016-04-14 DIAGNOSIS — R2689 Other abnormalities of gait and mobility: Secondary | ICD-10-CM | POA: Diagnosis not present

## 2016-04-14 DIAGNOSIS — M6281 Muscle weakness (generalized): Secondary | ICD-10-CM | POA: Diagnosis not present

## 2016-04-14 DIAGNOSIS — M25552 Pain in left hip: Secondary | ICD-10-CM | POA: Diagnosis not present

## 2016-04-15 DIAGNOSIS — Z87891 Personal history of nicotine dependence: Secondary | ICD-10-CM | POA: Diagnosis not present

## 2016-04-15 DIAGNOSIS — I4891 Unspecified atrial fibrillation: Secondary | ICD-10-CM | POA: Diagnosis not present

## 2016-04-15 DIAGNOSIS — I87312 Chronic venous hypertension (idiopathic) with ulcer of left lower extremity: Secondary | ICD-10-CM | POA: Diagnosis not present

## 2016-04-15 DIAGNOSIS — I10 Essential (primary) hypertension: Secondary | ICD-10-CM | POA: Diagnosis not present

## 2016-04-15 DIAGNOSIS — L97822 Non-pressure chronic ulcer of other part of left lower leg with fat layer exposed: Secondary | ICD-10-CM | POA: Diagnosis not present

## 2016-04-15 DIAGNOSIS — T888XXA Other specified complications of surgical and medical care, not elsewhere classified, initial encounter: Secondary | ICD-10-CM | POA: Diagnosis not present

## 2016-04-15 DIAGNOSIS — M1990 Unspecified osteoarthritis, unspecified site: Secondary | ICD-10-CM | POA: Diagnosis not present

## 2016-04-19 DIAGNOSIS — M6281 Muscle weakness (generalized): Secondary | ICD-10-CM | POA: Diagnosis not present

## 2016-04-19 DIAGNOSIS — R2689 Other abnormalities of gait and mobility: Secondary | ICD-10-CM | POA: Diagnosis not present

## 2016-04-19 DIAGNOSIS — M25552 Pain in left hip: Secondary | ICD-10-CM | POA: Diagnosis not present

## 2016-04-21 DIAGNOSIS — M25552 Pain in left hip: Secondary | ICD-10-CM | POA: Diagnosis not present

## 2016-04-21 DIAGNOSIS — M6281 Muscle weakness (generalized): Secondary | ICD-10-CM | POA: Diagnosis not present

## 2016-04-21 DIAGNOSIS — R2689 Other abnormalities of gait and mobility: Secondary | ICD-10-CM | POA: Diagnosis not present

## 2016-04-22 DIAGNOSIS — I872 Venous insufficiency (chronic) (peripheral): Secondary | ICD-10-CM | POA: Diagnosis not present

## 2016-04-22 DIAGNOSIS — L97929 Non-pressure chronic ulcer of unspecified part of left lower leg with unspecified severity: Secondary | ICD-10-CM | POA: Diagnosis not present

## 2016-04-22 DIAGNOSIS — L97229 Non-pressure chronic ulcer of left calf with unspecified severity: Secondary | ICD-10-CM | POA: Diagnosis not present

## 2016-04-22 DIAGNOSIS — I87312 Chronic venous hypertension (idiopathic) with ulcer of left lower extremity: Secondary | ICD-10-CM | POA: Diagnosis not present

## 2016-04-22 DIAGNOSIS — L97822 Non-pressure chronic ulcer of other part of left lower leg with fat layer exposed: Secondary | ICD-10-CM | POA: Diagnosis not present

## 2016-04-22 DIAGNOSIS — L97222 Non-pressure chronic ulcer of left calf with fat layer exposed: Secondary | ICD-10-CM | POA: Diagnosis not present

## 2016-04-26 DIAGNOSIS — R2689 Other abnormalities of gait and mobility: Secondary | ICD-10-CM | POA: Diagnosis not present

## 2016-04-26 DIAGNOSIS — M6281 Muscle weakness (generalized): Secondary | ICD-10-CM | POA: Diagnosis not present

## 2016-04-26 DIAGNOSIS — M25552 Pain in left hip: Secondary | ICD-10-CM | POA: Diagnosis not present

## 2016-04-28 DIAGNOSIS — M6281 Muscle weakness (generalized): Secondary | ICD-10-CM | POA: Diagnosis not present

## 2016-04-28 DIAGNOSIS — M25552 Pain in left hip: Secondary | ICD-10-CM | POA: Diagnosis not present

## 2016-04-28 DIAGNOSIS — R2689 Other abnormalities of gait and mobility: Secondary | ICD-10-CM | POA: Diagnosis not present

## 2016-04-29 DIAGNOSIS — L97822 Non-pressure chronic ulcer of other part of left lower leg with fat layer exposed: Secondary | ICD-10-CM | POA: Diagnosis not present

## 2016-04-29 DIAGNOSIS — T888XXA Other specified complications of surgical and medical care, not elsewhere classified, initial encounter: Secondary | ICD-10-CM | POA: Diagnosis not present

## 2016-04-29 DIAGNOSIS — I872 Venous insufficiency (chronic) (peripheral): Secondary | ICD-10-CM | POA: Diagnosis not present

## 2016-05-03 DIAGNOSIS — M6281 Muscle weakness (generalized): Secondary | ICD-10-CM | POA: Diagnosis not present

## 2016-05-03 DIAGNOSIS — M25552 Pain in left hip: Secondary | ICD-10-CM | POA: Diagnosis not present

## 2016-05-03 DIAGNOSIS — R2689 Other abnormalities of gait and mobility: Secondary | ICD-10-CM | POA: Diagnosis not present

## 2016-05-05 DIAGNOSIS — M25552 Pain in left hip: Secondary | ICD-10-CM | POA: Diagnosis not present

## 2016-05-05 DIAGNOSIS — M6281 Muscle weakness (generalized): Secondary | ICD-10-CM | POA: Diagnosis not present

## 2016-05-05 DIAGNOSIS — R2689 Other abnormalities of gait and mobility: Secondary | ICD-10-CM | POA: Diagnosis not present

## 2016-05-06 DIAGNOSIS — I872 Venous insufficiency (chronic) (peripheral): Secondary | ICD-10-CM | POA: Diagnosis not present

## 2016-05-06 DIAGNOSIS — I87312 Chronic venous hypertension (idiopathic) with ulcer of left lower extremity: Secondary | ICD-10-CM | POA: Diagnosis not present

## 2016-05-06 DIAGNOSIS — L97822 Non-pressure chronic ulcer of other part of left lower leg with fat layer exposed: Secondary | ICD-10-CM | POA: Diagnosis not present

## 2016-05-06 DIAGNOSIS — L97222 Non-pressure chronic ulcer of left calf with fat layer exposed: Secondary | ICD-10-CM | POA: Diagnosis not present

## 2016-05-10 DIAGNOSIS — M7989 Other specified soft tissue disorders: Secondary | ICD-10-CM | POA: Diagnosis not present

## 2016-05-10 DIAGNOSIS — I872 Venous insufficiency (chronic) (peripheral): Secondary | ICD-10-CM | POA: Diagnosis not present

## 2016-05-12 DIAGNOSIS — R2689 Other abnormalities of gait and mobility: Secondary | ICD-10-CM | POA: Diagnosis not present

## 2016-05-12 DIAGNOSIS — M25552 Pain in left hip: Secondary | ICD-10-CM | POA: Diagnosis not present

## 2016-05-12 DIAGNOSIS — M6281 Muscle weakness (generalized): Secondary | ICD-10-CM | POA: Diagnosis not present

## 2016-05-13 DIAGNOSIS — I872 Venous insufficiency (chronic) (peripheral): Secondary | ICD-10-CM | POA: Diagnosis not present

## 2016-05-13 DIAGNOSIS — Z09 Encounter for follow-up examination after completed treatment for conditions other than malignant neoplasm: Secondary | ICD-10-CM | POA: Diagnosis not present

## 2016-05-14 DIAGNOSIS — Z96649 Presence of unspecified artificial hip joint: Secondary | ICD-10-CM | POA: Diagnosis not present

## 2016-05-19 DIAGNOSIS — M25552 Pain in left hip: Secondary | ICD-10-CM | POA: Diagnosis not present

## 2016-05-19 DIAGNOSIS — M6281 Muscle weakness (generalized): Secondary | ICD-10-CM | POA: Diagnosis not present

## 2016-05-19 DIAGNOSIS — R2689 Other abnormalities of gait and mobility: Secondary | ICD-10-CM | POA: Diagnosis not present

## 2016-05-20 DIAGNOSIS — S8392XA Sprain of unspecified site of left knee, initial encounter: Secondary | ICD-10-CM | POA: Diagnosis not present

## 2016-05-20 DIAGNOSIS — M25562 Pain in left knee: Secondary | ICD-10-CM | POA: Diagnosis not present

## 2016-05-20 DIAGNOSIS — R52 Pain, unspecified: Secondary | ICD-10-CM | POA: Diagnosis not present

## 2016-05-21 DIAGNOSIS — R52 Pain, unspecified: Secondary | ICD-10-CM | POA: Diagnosis not present

## 2016-05-21 DIAGNOSIS — S8392XA Sprain of unspecified site of left knee, initial encounter: Secondary | ICD-10-CM | POA: Diagnosis not present

## 2016-05-21 DIAGNOSIS — M79605 Pain in left leg: Secondary | ICD-10-CM | POA: Diagnosis not present

## 2016-05-21 DIAGNOSIS — S8390XA Sprain of unspecified site of unspecified knee, initial encounter: Secondary | ICD-10-CM | POA: Diagnosis not present

## 2016-05-24 DIAGNOSIS — M25562 Pain in left knee: Secondary | ICD-10-CM | POA: Diagnosis not present

## 2016-05-27 DIAGNOSIS — T8484XA Pain due to internal orthopedic prosthetic devices, implants and grafts, initial encounter: Secondary | ICD-10-CM | POA: Diagnosis not present

## 2016-05-27 DIAGNOSIS — M25562 Pain in left knee: Secondary | ICD-10-CM | POA: Diagnosis not present

## 2016-05-27 DIAGNOSIS — Z96649 Presence of unspecified artificial hip joint: Secondary | ICD-10-CM | POA: Diagnosis not present

## 2016-06-01 DIAGNOSIS — M25562 Pain in left knee: Secondary | ICD-10-CM | POA: Diagnosis not present

## 2016-06-01 DIAGNOSIS — Z96652 Presence of left artificial knee joint: Secondary | ICD-10-CM | POA: Diagnosis not present

## 2016-06-01 DIAGNOSIS — T8484XA Pain due to internal orthopedic prosthetic devices, implants and grafts, initial encounter: Secondary | ICD-10-CM | POA: Diagnosis not present

## 2016-06-04 DIAGNOSIS — T8484XA Pain due to internal orthopedic prosthetic devices, implants and grafts, initial encounter: Secondary | ICD-10-CM | POA: Diagnosis not present

## 2016-08-16 DIAGNOSIS — Z96649 Presence of unspecified artificial hip joint: Secondary | ICD-10-CM | POA: Diagnosis not present

## 2016-08-16 DIAGNOSIS — M25552 Pain in left hip: Secondary | ICD-10-CM | POA: Diagnosis not present

## 2016-09-06 IMAGING — CR DG CHEST 2V
2 series · 2 of 2 positions shown · non-contrast
Comparison: None.

CLINICAL DATA: New onset atrial fibrillation. Patient awoke with
chest pain last night.

EXAM:
CHEST  2 VIEW

[chest lat]
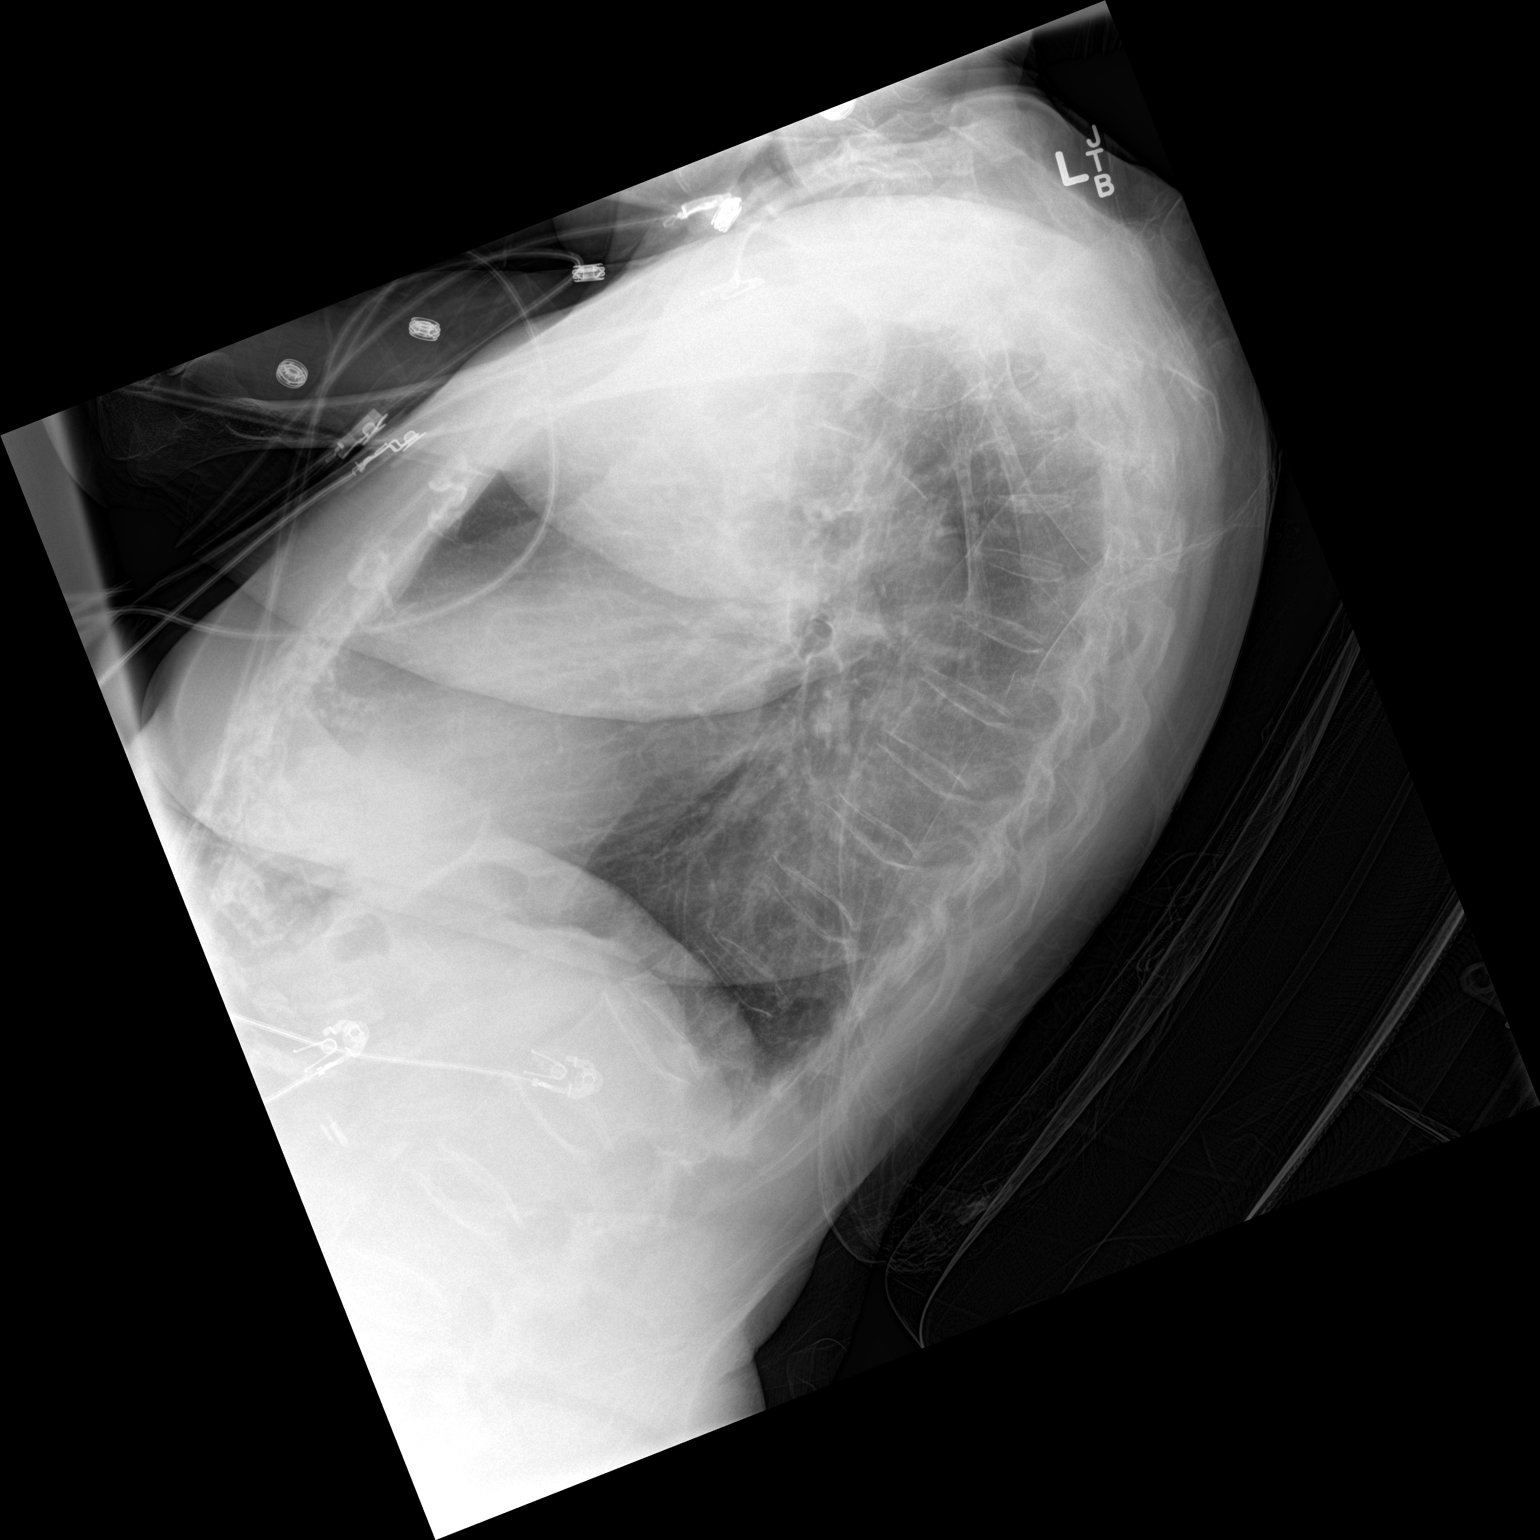

[chest ap]
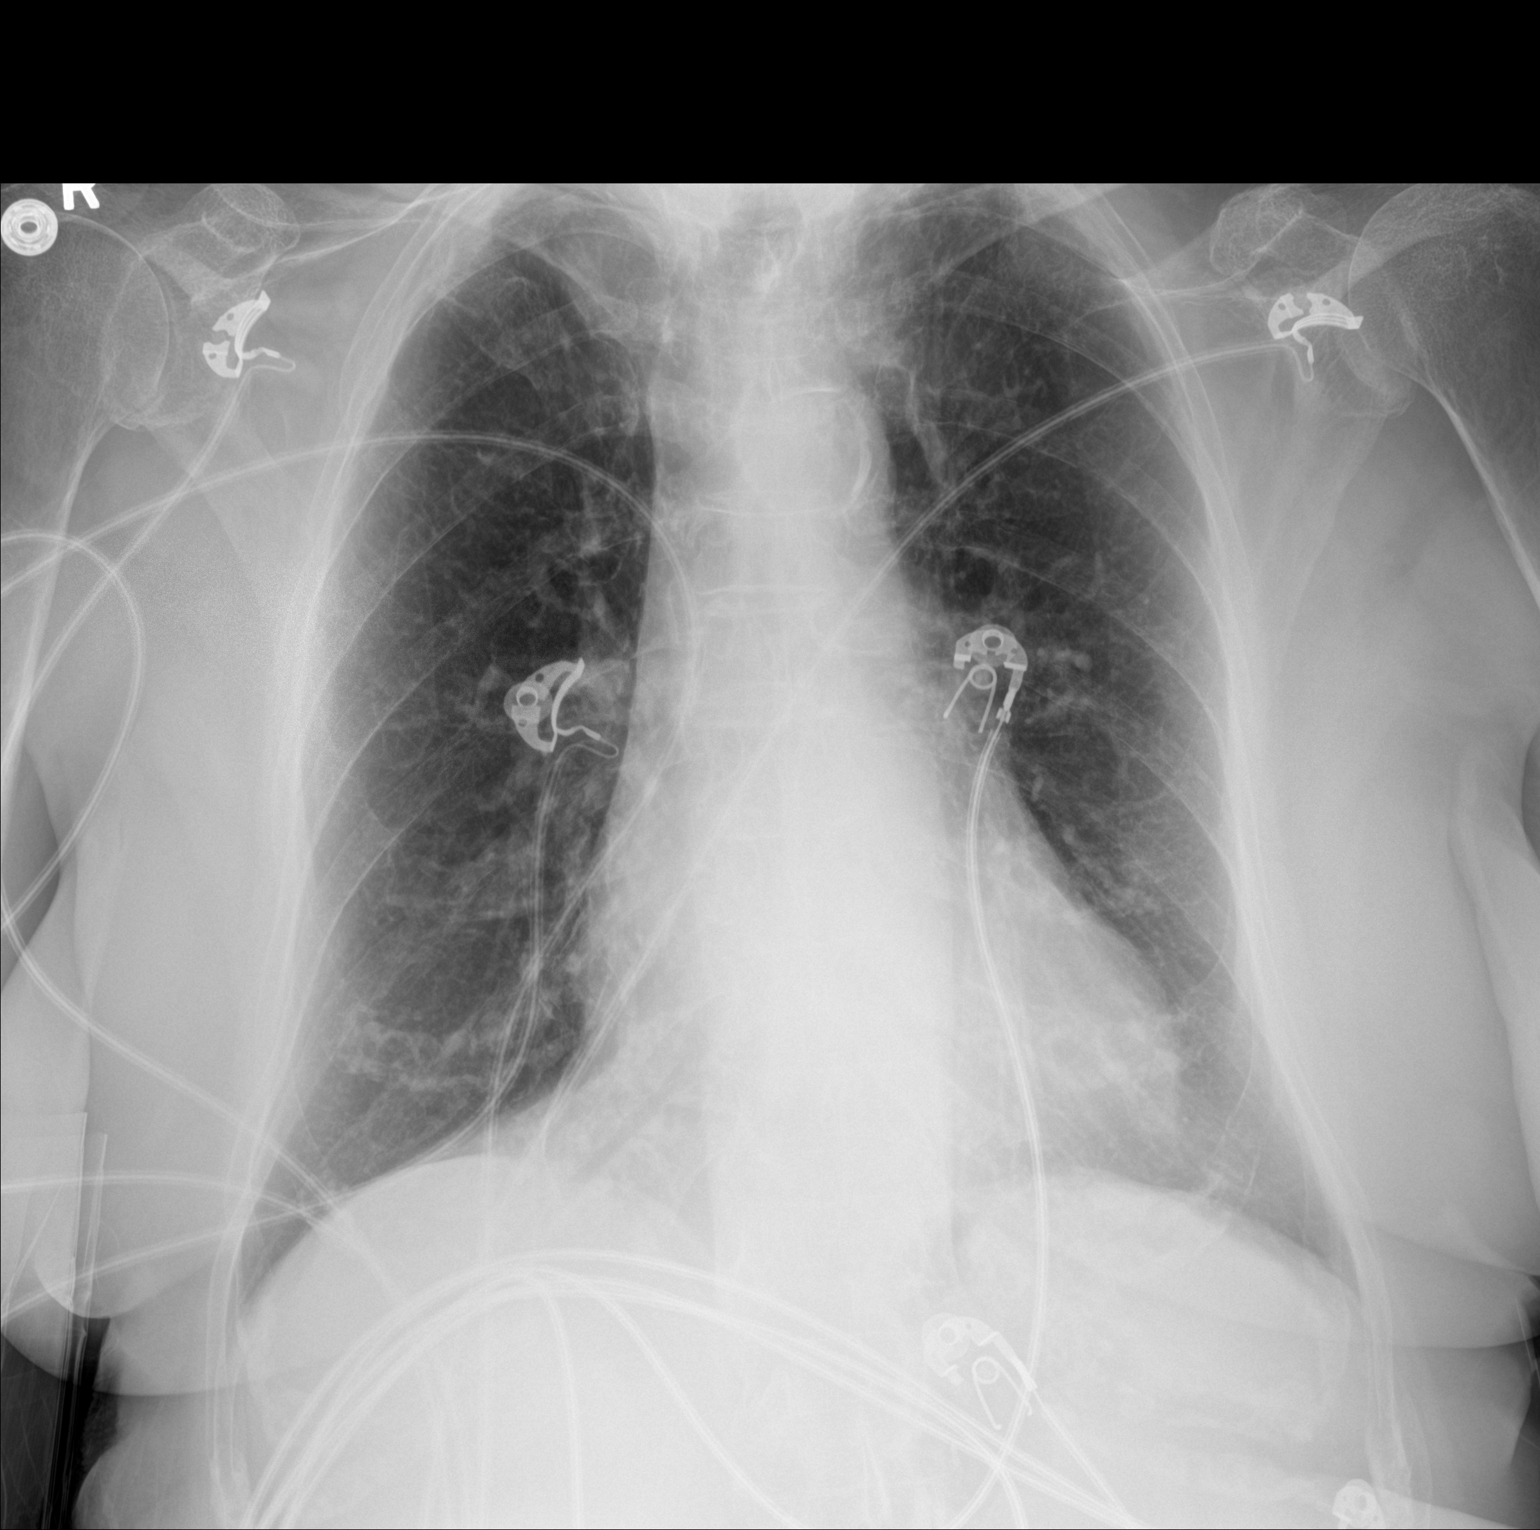

[2 of 2 positions shown; findings below may reference images not displayed]

FINDINGS: Hyperinflation is present compatible with emphysema. Osteopenia.
Mild thoracic kyphosis.

The cardiopericardial silhouette is within normal limits. No
airspace disease. No effusion. Monitoring leads project over the
chest.
IMPRESSION: No acute cardiopulmonary disease. Mild hyperinflation suggesting
emphysema.

## 2016-11-03 DIAGNOSIS — M778 Other enthesopathies, not elsewhere classified: Secondary | ICD-10-CM | POA: Diagnosis not present

## 2016-11-03 DIAGNOSIS — Z0000 Encounter for general adult medical examination without abnormal findings: Secondary | ICD-10-CM | POA: Diagnosis not present

## 2016-11-03 DIAGNOSIS — Z6824 Body mass index (BMI) 24.0-24.9, adult: Secondary | ICD-10-CM | POA: Diagnosis not present

## 2016-12-06 DIAGNOSIS — J42 Unspecified chronic bronchitis: Secondary | ICD-10-CM | POA: Diagnosis not present

## 2016-12-06 DIAGNOSIS — K591 Functional diarrhea: Secondary | ICD-10-CM | POA: Diagnosis not present

## 2016-12-31 DIAGNOSIS — N3091 Cystitis, unspecified with hematuria: Secondary | ICD-10-CM | POA: Diagnosis not present

## 2016-12-31 DIAGNOSIS — R350 Frequency of micturition: Secondary | ICD-10-CM | POA: Diagnosis not present

## 2016-12-31 DIAGNOSIS — Z6824 Body mass index (BMI) 24.0-24.9, adult: Secondary | ICD-10-CM | POA: Diagnosis not present

## 2017-01-03 DIAGNOSIS — R42 Dizziness and giddiness: Secondary | ICD-10-CM | POA: Diagnosis not present

## 2017-01-03 DIAGNOSIS — R0602 Shortness of breath: Secondary | ICD-10-CM | POA: Diagnosis not present

## 2017-01-03 DIAGNOSIS — R1084 Generalized abdominal pain: Secondary | ICD-10-CM | POA: Diagnosis not present

## 2017-01-05 DIAGNOSIS — Z9842 Cataract extraction status, left eye: Secondary | ICD-10-CM | POA: Diagnosis not present

## 2017-01-05 DIAGNOSIS — H43813 Vitreous degeneration, bilateral: Secondary | ICD-10-CM | POA: Diagnosis not present

## 2017-01-10 DIAGNOSIS — R350 Frequency of micturition: Secondary | ICD-10-CM | POA: Diagnosis not present

## 2017-01-10 DIAGNOSIS — I4891 Unspecified atrial fibrillation: Secondary | ICD-10-CM | POA: Diagnosis not present

## 2017-01-10 DIAGNOSIS — Z6824 Body mass index (BMI) 24.0-24.9, adult: Secondary | ICD-10-CM | POA: Diagnosis not present

## 2017-03-22 DIAGNOSIS — R109 Unspecified abdominal pain: Secondary | ICD-10-CM | POA: Diagnosis not present

## 2017-03-22 DIAGNOSIS — J329 Chronic sinusitis, unspecified: Secondary | ICD-10-CM | POA: Diagnosis not present

## 2017-03-22 DIAGNOSIS — Z6824 Body mass index (BMI) 24.0-24.9, adult: Secondary | ICD-10-CM | POA: Diagnosis not present

## 2017-03-29 DIAGNOSIS — R079 Chest pain, unspecified: Secondary | ICD-10-CM | POA: Diagnosis not present

## 2017-03-29 DIAGNOSIS — R109 Unspecified abdominal pain: Secondary | ICD-10-CM | POA: Diagnosis not present

## 2017-03-29 DIAGNOSIS — R1033 Periumbilical pain: Secondary | ICD-10-CM | POA: Diagnosis not present

## 2017-03-29 DIAGNOSIS — R1031 Right lower quadrant pain: Secondary | ICD-10-CM | POA: Diagnosis not present

## 2017-03-29 DIAGNOSIS — R197 Diarrhea, unspecified: Secondary | ICD-10-CM | POA: Diagnosis not present

## 2017-03-29 DIAGNOSIS — I10 Essential (primary) hypertension: Secondary | ICD-10-CM | POA: Diagnosis not present

## 2017-03-29 DIAGNOSIS — I4891 Unspecified atrial fibrillation: Secondary | ICD-10-CM | POA: Diagnosis not present

## 2017-05-25 DIAGNOSIS — R1031 Right lower quadrant pain: Secondary | ICD-10-CM | POA: Diagnosis not present

## 2017-05-25 DIAGNOSIS — Z7901 Long term (current) use of anticoagulants: Secondary | ICD-10-CM | POA: Diagnosis not present

## 2017-05-25 DIAGNOSIS — R0789 Other chest pain: Secondary | ICD-10-CM | POA: Diagnosis not present

## 2017-05-25 DIAGNOSIS — I4891 Unspecified atrial fibrillation: Secondary | ICD-10-CM | POA: Diagnosis not present

## 2017-05-25 DIAGNOSIS — R0602 Shortness of breath: Secondary | ICD-10-CM | POA: Diagnosis not present

## 2017-05-25 DIAGNOSIS — R1012 Left upper quadrant pain: Secondary | ICD-10-CM | POA: Diagnosis not present

## 2017-05-25 DIAGNOSIS — R1011 Right upper quadrant pain: Secondary | ICD-10-CM | POA: Diagnosis not present

## 2017-05-25 DIAGNOSIS — R197 Diarrhea, unspecified: Secondary | ICD-10-CM | POA: Diagnosis not present

## 2017-05-27 DIAGNOSIS — R1031 Right lower quadrant pain: Secondary | ICD-10-CM | POA: Diagnosis not present

## 2017-05-27 DIAGNOSIS — R197 Diarrhea, unspecified: Secondary | ICD-10-CM | POA: Diagnosis not present

## 2017-05-30 DIAGNOSIS — A09 Infectious gastroenteritis and colitis, unspecified: Secondary | ICD-10-CM | POA: Diagnosis not present

## 2017-06-14 DIAGNOSIS — Z6824 Body mass index (BMI) 24.0-24.9, adult: Secondary | ICD-10-CM | POA: Diagnosis not present

## 2017-06-14 DIAGNOSIS — G4700 Insomnia, unspecified: Secondary | ICD-10-CM | POA: Diagnosis not present

## 2017-08-10 DIAGNOSIS — H1089 Other conjunctivitis: Secondary | ICD-10-CM | POA: Diagnosis not present

## 2017-08-28 DIAGNOSIS — R531 Weakness: Secondary | ICD-10-CM | POA: Diagnosis not present

## 2017-08-28 DIAGNOSIS — I4891 Unspecified atrial fibrillation: Secondary | ICD-10-CM | POA: Diagnosis not present

## 2017-08-28 DIAGNOSIS — J984 Other disorders of lung: Secondary | ICD-10-CM | POA: Diagnosis not present

## 2017-08-28 DIAGNOSIS — K219 Gastro-esophageal reflux disease without esophagitis: Secondary | ICD-10-CM | POA: Diagnosis not present

## 2017-08-28 DIAGNOSIS — I10 Essential (primary) hypertension: Secondary | ICD-10-CM | POA: Diagnosis not present

## 2017-08-28 DIAGNOSIS — E7800 Pure hypercholesterolemia, unspecified: Secondary | ICD-10-CM | POA: Diagnosis not present

## 2017-08-28 DIAGNOSIS — R55 Syncope and collapse: Secondary | ICD-10-CM | POA: Diagnosis not present

## 2017-08-28 DIAGNOSIS — M1990 Unspecified osteoarthritis, unspecified site: Secondary | ICD-10-CM | POA: Diagnosis not present

## 2017-08-28 DIAGNOSIS — R404 Transient alteration of awareness: Secondary | ICD-10-CM | POA: Diagnosis not present

## 2017-09-06 DIAGNOSIS — Z9181 History of falling: Secondary | ICD-10-CM | POA: Diagnosis not present

## 2017-09-06 DIAGNOSIS — Z6823 Body mass index (BMI) 23.0-23.9, adult: Secondary | ICD-10-CM | POA: Diagnosis not present

## 2017-09-06 DIAGNOSIS — Z1339 Encounter for screening examination for other mental health and behavioral disorders: Secondary | ICD-10-CM | POA: Diagnosis not present

## 2017-09-06 DIAGNOSIS — L819 Disorder of pigmentation, unspecified: Secondary | ICD-10-CM | POA: Diagnosis not present

## 2017-09-06 DIAGNOSIS — Z0000 Encounter for general adult medical examination without abnormal findings: Secondary | ICD-10-CM | POA: Diagnosis not present

## 2017-09-08 DIAGNOSIS — R233 Spontaneous ecchymoses: Secondary | ICD-10-CM | POA: Diagnosis not present

## 2017-09-08 DIAGNOSIS — L821 Other seborrheic keratosis: Secondary | ICD-10-CM | POA: Diagnosis not present

## 2017-09-24 DIAGNOSIS — J069 Acute upper respiratory infection, unspecified: Secondary | ICD-10-CM | POA: Diagnosis not present

## 2017-10-03 DIAGNOSIS — J309 Allergic rhinitis, unspecified: Secondary | ICD-10-CM | POA: Diagnosis not present

## 2017-10-03 DIAGNOSIS — Z6823 Body mass index (BMI) 23.0-23.9, adult: Secondary | ICD-10-CM | POA: Diagnosis not present

## 2017-10-17 DIAGNOSIS — I10 Essential (primary) hypertension: Secondary | ICD-10-CM | POA: Diagnosis not present

## 2017-10-17 DIAGNOSIS — Z6823 Body mass index (BMI) 23.0-23.9, adult: Secondary | ICD-10-CM | POA: Diagnosis not present

## 2017-10-17 DIAGNOSIS — B370 Candidal stomatitis: Secondary | ICD-10-CM | POA: Diagnosis not present

## 2017-10-25 DIAGNOSIS — E871 Hypo-osmolality and hyponatremia: Secondary | ICD-10-CM | POA: Diagnosis not present

## 2017-11-08 DIAGNOSIS — E871 Hypo-osmolality and hyponatremia: Secondary | ICD-10-CM | POA: Diagnosis not present

## 2017-11-22 DIAGNOSIS — E871 Hypo-osmolality and hyponatremia: Secondary | ICD-10-CM | POA: Diagnosis not present

## 2017-12-20 DIAGNOSIS — E871 Hypo-osmolality and hyponatremia: Secondary | ICD-10-CM | POA: Diagnosis not present

## 2017-12-22 DIAGNOSIS — K529 Noninfective gastroenteritis and colitis, unspecified: Secondary | ICD-10-CM | POA: Diagnosis not present

## 2017-12-22 DIAGNOSIS — Z6824 Body mass index (BMI) 24.0-24.9, adult: Secondary | ICD-10-CM | POA: Diagnosis not present

## 2017-12-24 DIAGNOSIS — B370 Candidal stomatitis: Secondary | ICD-10-CM | POA: Diagnosis not present

## 2017-12-26 DIAGNOSIS — T50995A Adverse effect of other drugs, medicaments and biological substances, initial encounter: Secondary | ICD-10-CM | POA: Diagnosis not present

## 2017-12-26 DIAGNOSIS — B370 Candidal stomatitis: Secondary | ICD-10-CM | POA: Diagnosis not present

## 2017-12-26 DIAGNOSIS — R21 Rash and other nonspecific skin eruption: Secondary | ICD-10-CM | POA: Diagnosis not present

## 2018-01-23 DIAGNOSIS — Z79899 Other long term (current) drug therapy: Secondary | ICD-10-CM | POA: Diagnosis not present

## 2018-01-23 DIAGNOSIS — Z6822 Body mass index (BMI) 22.0-22.9, adult: Secondary | ICD-10-CM | POA: Diagnosis not present

## 2018-01-23 DIAGNOSIS — B370 Candidal stomatitis: Secondary | ICD-10-CM | POA: Diagnosis not present

## 2018-01-23 DIAGNOSIS — R109 Unspecified abdominal pain: Secondary | ICD-10-CM | POA: Diagnosis not present

## 2018-02-07 DIAGNOSIS — E871 Hypo-osmolality and hyponatremia: Secondary | ICD-10-CM | POA: Diagnosis not present

## 2018-02-21 DIAGNOSIS — E871 Hypo-osmolality and hyponatremia: Secondary | ICD-10-CM | POA: Diagnosis not present

## 2018-02-21 DIAGNOSIS — H43813 Vitreous degeneration, bilateral: Secondary | ICD-10-CM | POA: Diagnosis not present

## 2018-02-21 DIAGNOSIS — H52223 Regular astigmatism, bilateral: Secondary | ICD-10-CM | POA: Diagnosis not present

## 2018-03-05 DIAGNOSIS — I10 Essential (primary) hypertension: Secondary | ICD-10-CM | POA: Diagnosis not present

## 2018-03-05 DIAGNOSIS — K219 Gastro-esophageal reflux disease without esophagitis: Secondary | ICD-10-CM | POA: Diagnosis not present

## 2018-03-05 DIAGNOSIS — I4891 Unspecified atrial fibrillation: Secondary | ICD-10-CM | POA: Diagnosis not present

## 2018-03-05 DIAGNOSIS — E7800 Pure hypercholesterolemia, unspecified: Secondary | ICD-10-CM | POA: Diagnosis not present

## 2018-03-05 DIAGNOSIS — R1031 Right lower quadrant pain: Secondary | ICD-10-CM | POA: Diagnosis not present

## 2018-03-05 DIAGNOSIS — R109 Unspecified abdominal pain: Secondary | ICD-10-CM | POA: Diagnosis not present

## 2018-03-05 DIAGNOSIS — R197 Diarrhea, unspecified: Secondary | ICD-10-CM | POA: Diagnosis not present

## 2018-03-06 DIAGNOSIS — R5383 Other fatigue: Secondary | ICD-10-CM | POA: Diagnosis not present

## 2018-03-06 DIAGNOSIS — B370 Candidal stomatitis: Secondary | ICD-10-CM | POA: Diagnosis not present

## 2018-03-06 DIAGNOSIS — R1031 Right lower quadrant pain: Secondary | ICD-10-CM | POA: Diagnosis not present

## 2018-03-06 DIAGNOSIS — E871 Hypo-osmolality and hyponatremia: Secondary | ICD-10-CM | POA: Diagnosis not present

## 2018-03-06 DIAGNOSIS — R2689 Other abnormalities of gait and mobility: Secondary | ICD-10-CM | POA: Diagnosis not present

## 2018-03-06 DIAGNOSIS — R197 Diarrhea, unspecified: Secondary | ICD-10-CM | POA: Diagnosis not present

## 2018-03-06 DIAGNOSIS — I10 Essential (primary) hypertension: Secondary | ICD-10-CM | POA: Diagnosis not present

## 2018-03-06 DIAGNOSIS — I4891 Unspecified atrial fibrillation: Secondary | ICD-10-CM | POA: Diagnosis not present

## 2018-03-06 DIAGNOSIS — Z79899 Other long term (current) drug therapy: Secondary | ICD-10-CM | POA: Diagnosis not present

## 2018-03-06 DIAGNOSIS — Z7901 Long term (current) use of anticoagulants: Secondary | ICD-10-CM | POA: Diagnosis not present

## 2018-03-06 DIAGNOSIS — K529 Noninfective gastroenteritis and colitis, unspecified: Secondary | ICD-10-CM | POA: Diagnosis not present

## 2018-03-06 DIAGNOSIS — E860 Dehydration: Secondary | ICD-10-CM | POA: Diagnosis not present

## 2018-03-07 DIAGNOSIS — I10 Essential (primary) hypertension: Secondary | ICD-10-CM | POA: Diagnosis not present

## 2018-03-07 DIAGNOSIS — E871 Hypo-osmolality and hyponatremia: Secondary | ICD-10-CM | POA: Diagnosis not present

## 2018-03-07 DIAGNOSIS — K529 Noninfective gastroenteritis and colitis, unspecified: Secondary | ICD-10-CM | POA: Diagnosis not present

## 2018-03-07 DIAGNOSIS — I4891 Unspecified atrial fibrillation: Secondary | ICD-10-CM | POA: Diagnosis not present

## 2018-03-07 DIAGNOSIS — E639 Nutritional deficiency, unspecified: Secondary | ICD-10-CM | POA: Diagnosis not present

## 2018-03-08 DIAGNOSIS — Z7901 Long term (current) use of anticoagulants: Secondary | ICD-10-CM | POA: Diagnosis not present

## 2018-03-08 DIAGNOSIS — E871 Hypo-osmolality and hyponatremia: Secondary | ICD-10-CM | POA: Diagnosis not present

## 2018-03-08 DIAGNOSIS — I4891 Unspecified atrial fibrillation: Secondary | ICD-10-CM | POA: Diagnosis not present

## 2018-03-13 DIAGNOSIS — I4891 Unspecified atrial fibrillation: Secondary | ICD-10-CM | POA: Diagnosis not present

## 2018-03-13 DIAGNOSIS — K625 Hemorrhage of anus and rectum: Secondary | ICD-10-CM | POA: Diagnosis not present

## 2018-03-13 DIAGNOSIS — Z794 Long term (current) use of insulin: Secondary | ICD-10-CM | POA: Diagnosis not present

## 2018-03-13 DIAGNOSIS — Z79899 Other long term (current) drug therapy: Secondary | ICD-10-CM | POA: Diagnosis not present

## 2018-03-13 DIAGNOSIS — I10 Essential (primary) hypertension: Secondary | ICD-10-CM | POA: Diagnosis not present

## 2018-03-13 DIAGNOSIS — R1010 Upper abdominal pain, unspecified: Secondary | ICD-10-CM | POA: Diagnosis not present

## 2018-03-13 DIAGNOSIS — I959 Hypotension, unspecified: Secondary | ICD-10-CM | POA: Diagnosis not present

## 2018-03-13 DIAGNOSIS — K449 Diaphragmatic hernia without obstruction or gangrene: Secondary | ICD-10-CM | POA: Diagnosis not present

## 2018-03-13 DIAGNOSIS — Z7901 Long term (current) use of anticoagulants: Secondary | ICD-10-CM | POA: Diagnosis not present

## 2018-03-13 DIAGNOSIS — Z791 Long term (current) use of non-steroidal anti-inflammatories (NSAID): Secondary | ICD-10-CM | POA: Diagnosis not present

## 2018-03-13 DIAGNOSIS — E871 Hypo-osmolality and hyponatremia: Secondary | ICD-10-CM | POA: Diagnosis not present

## 2018-03-13 DIAGNOSIS — R531 Weakness: Secondary | ICD-10-CM | POA: Diagnosis not present

## 2018-03-13 DIAGNOSIS — R042 Hemoptysis: Secondary | ICD-10-CM | POA: Diagnosis not present

## 2018-03-13 DIAGNOSIS — R58 Hemorrhage, not elsewhere classified: Secondary | ICD-10-CM | POA: Diagnosis not present

## 2018-03-16 DIAGNOSIS — K589 Irritable bowel syndrome without diarrhea: Secondary | ICD-10-CM | POA: Diagnosis not present

## 2018-03-16 DIAGNOSIS — Z6823 Body mass index (BMI) 23.0-23.9, adult: Secondary | ICD-10-CM | POA: Diagnosis not present

## 2018-03-21 DIAGNOSIS — E871 Hypo-osmolality and hyponatremia: Secondary | ICD-10-CM | POA: Diagnosis not present

## 2018-04-04 DIAGNOSIS — E871 Hypo-osmolality and hyponatremia: Secondary | ICD-10-CM | POA: Diagnosis not present

## 2018-04-13 DIAGNOSIS — E871 Hypo-osmolality and hyponatremia: Secondary | ICD-10-CM | POA: Diagnosis not present

## 2018-04-13 DIAGNOSIS — R410 Disorientation, unspecified: Secondary | ICD-10-CM | POA: Diagnosis not present

## 2018-04-13 DIAGNOSIS — Z6823 Body mass index (BMI) 23.0-23.9, adult: Secondary | ICD-10-CM | POA: Diagnosis not present

## 2018-04-24 DIAGNOSIS — R413 Other amnesia: Secondary | ICD-10-CM | POA: Diagnosis not present

## 2018-04-24 DIAGNOSIS — Z6823 Body mass index (BMI) 23.0-23.9, adult: Secondary | ICD-10-CM | POA: Diagnosis not present

## 2018-04-26 ENCOUNTER — Other Ambulatory Visit: Payer: Self-pay | Admitting: Family Medicine

## 2018-04-26 DIAGNOSIS — I639 Cerebral infarction, unspecified: Secondary | ICD-10-CM

## 2018-05-09 ENCOUNTER — Ambulatory Visit
Admission: RE | Admit: 2018-05-09 | Discharge: 2018-05-09 | Disposition: A | Discharge status: Home / Self Care | Payer: Medicare Other | Source: Ambulatory Visit | Attending: Family Medicine | Admitting: Family Medicine

## 2018-05-09 DIAGNOSIS — I639 Cerebral infarction, unspecified: Secondary | ICD-10-CM

## 2018-05-16 DIAGNOSIS — M79671 Pain in right foot: Secondary | ICD-10-CM | POA: Diagnosis not present

## 2018-05-17 ENCOUNTER — Ambulatory Visit: Payer: Medicare Other | Admitting: Sports Medicine

## 2018-05-17 ENCOUNTER — Encounter: Payer: Self-pay | Admitting: Sports Medicine

## 2018-05-17 VITALS — Temp 98.3°F

## 2018-05-17 DIAGNOSIS — D689 Coagulation defect, unspecified: Secondary | ICD-10-CM

## 2018-05-17 DIAGNOSIS — M21619 Bunion of unspecified foot: Secondary | ICD-10-CM

## 2018-05-17 DIAGNOSIS — M2041 Other hammer toe(s) (acquired), right foot: Secondary | ICD-10-CM

## 2018-05-17 DIAGNOSIS — L909 Atrophic disorder of skin, unspecified: Secondary | ICD-10-CM

## 2018-05-17 DIAGNOSIS — L84 Corns and callosities: Secondary | ICD-10-CM | POA: Diagnosis not present

## 2018-05-17 DIAGNOSIS — M79671 Pain in right foot: Secondary | ICD-10-CM

## 2018-05-17 DIAGNOSIS — R29898 Other symptoms and signs involving the musculoskeletal system: Secondary | ICD-10-CM

## 2018-05-17 DIAGNOSIS — M2042 Other hammer toe(s) (acquired), left foot: Secondary | ICD-10-CM

## 2018-05-17 NOTE — Progress Notes (Signed)
Subjective: Gail Mcclure is a 84 y.o. female patient who presents to office for evaluation of Right> Left foot pain secondary to callus skin at the ball on the right that has been a dark spot and it. Patient complains of pain at the lesion present Right foot since 1 week ago.  Patient is assisted by niece who states that they took her to urgent care over the weekend after they seen this dark area on the bottom of her foot of which the urgent care doctor stated that she needed to follow-up with a podiatrist.  Patient is on blood thinner and is not sure the reason why but likely due to past history it could be related to her A. fib.  Patient and niece states that they became concerned because she is on blood thinner and wants to have her foot checked.  Patient denies any other symptoms at this time.  Review of Systems  All other systems reviewed and are negative.    Patient Active Problem List   Diagnosis Date Noted  . New onset atrial fibrillation (CHADVASc = 4) 03/13/2015  . HTN (hypertension) 03/13/2015  . Chest pain 03/13/2015  . HLD (hyperlipidemia) 03/13/2015    Current Outpatient Medications on File Prior to Visit  Medication Sig Dispense Refill  . acetaminophen (TYLENOL) 500 MG tablet Take by mouth.    Marland Kitchen apixaban (ELIQUIS) 5 MG TABS tablet Take 1 tablet (5 mg total) by mouth 2 (two) times daily. 60 tablet 0  . colestipol (COLESTID) 5 g packet MIX 1/2 PACKET WITH 3 OUNCES OF LIQUID AND DRINK ONCE DAILY  5  . diltiazem (CARDIZEM CD) 120 MG 24 hr capsule Take 1 capsule (120 mg total) by mouth daily. 30 capsule 0  . donepezil (ARICEPT) 5 MG tablet Take 5 mg by mouth every evening.  3  . Multiple Vitamin (MULTIVITAMIN WITH MINERALS) TABS tablet Take 1 tablet by mouth daily.    Marland Kitchen omeprazole (PRILOSEC) 20 MG capsule Take 20 mg by mouth daily.  0  . traZODone (DESYREL) 50 MG tablet Take 25 mg by mouth at bedtime.  1   No current facility-administered medications on file prior to visit.      Allergies  Allergen Reactions  . Azithromycin Diarrhea  . Nystatin Rash  . Tetracycline Rash    Objective:  General: Alert and oriented x3 in no acute distress  Dermatology: Keratotic lesion present submit to on right with skin lines transversing the lesion and a small amount of dried heme, pain is present with direct pressure to the lesion with a central nucleated core noted, no webspace macerations, no ecchymosis bilateral, all nails x 10 are well manicured.  Vascular: Dorsalis Pedis and Posterior Tibial pedal pulses 1/4, Capillary Fill Time 5 seconds, + pedal hair growth bilateral, varicosities bilateral, no edema bilateral lower extremities, Temperature gradient within normal limits.  Neurology: Michaell Cowing sensation intact via light touch bilateral.  Musculoskeletal: Mild tenderness with palpation at the keratotic lesion site on Right with significant fat pad atrophy and hammertoe and bunion deformity with second toe right greater than left dorsal dislocation and metatarsal prominence, Muscular strength 4/5 in all groups without pain or limitation on range of motion.   Assessment and Plan: Problem List Items Addressed This Visit    None    Visit Diagnoses    Pre-ulcerative calluses    -  Primary   Right foot pain       Fat pad atrophy of foot  Coagulopathy (HCC)       Bunion       Hammer toes of both feet          -Complete examination performed -Discussed treatment options -Parred keratoic lesion using a chisel blade; treated the area with offloading pad Betadine and Band-Aid to area-Encouraged daily skin emollients -Encouraged use of pumice stone -Advised good supportive shoes and inserts -Advised patient and niece at the area opens to apply Betadine or Polysporin to the area and return to office -Patient to return to office as needed or sooner if condition worsens.  Asencion Islam, DPM

## 2018-05-17 NOTE — Progress Notes (Signed)
   Subjective:    Patient ID: Gail Mcclure, female    DOB: [birth year], 84 y.o.   MRN: [id number]  HPI    Review of Systems  All other systems reviewed and are negative.      Objective:   Physical Exam        Assessment & Plan:

## 2018-07-13 DIAGNOSIS — Z6824 Body mass index (BMI) 24.0-24.9, adult: Secondary | ICD-10-CM | POA: Diagnosis not present

## 2018-07-13 DIAGNOSIS — J329 Chronic sinusitis, unspecified: Secondary | ICD-10-CM | POA: Diagnosis not present

## 2018-07-13 DIAGNOSIS — B370 Candidal stomatitis: Secondary | ICD-10-CM | POA: Diagnosis not present

## 2018-07-18 DIAGNOSIS — J069 Acute upper respiratory infection, unspecified: Secondary | ICD-10-CM | POA: Diagnosis not present

## 2018-07-18 DIAGNOSIS — R51 Headache: Secondary | ICD-10-CM | POA: Diagnosis not present

## 2018-07-18 DIAGNOSIS — E785 Hyperlipidemia, unspecified: Secondary | ICD-10-CM | POA: Diagnosis not present

## 2018-07-18 DIAGNOSIS — Z881 Allergy status to other antibiotic agents status: Secondary | ICD-10-CM | POA: Diagnosis not present

## 2018-07-18 DIAGNOSIS — G44209 Tension-type headache, unspecified, not intractable: Secondary | ICD-10-CM | POA: Diagnosis not present

## 2018-07-18 DIAGNOSIS — Z7901 Long term (current) use of anticoagulants: Secondary | ICD-10-CM | POA: Diagnosis not present

## 2018-07-18 DIAGNOSIS — I4891 Unspecified atrial fibrillation: Secondary | ICD-10-CM | POA: Diagnosis not present

## 2018-07-25 DIAGNOSIS — R509 Fever, unspecified: Secondary | ICD-10-CM | POA: Diagnosis not present

## 2018-07-25 DIAGNOSIS — N3090 Cystitis, unspecified without hematuria: Secondary | ICD-10-CM | POA: Diagnosis not present

## 2023-06-28 ENCOUNTER — Inpatient Hospital Stay: Payer: Medicare (Managed Care)

## 2023-06-28 ENCOUNTER — Inpatient Hospital Stay: Payer: Medicare (Managed Care) | Attending: Oncology | Admitting: Oncology

## 2023-06-28 ENCOUNTER — Encounter: Payer: Self-pay | Admitting: Oncology

## 2023-06-28 VITALS — BP 98/56 | HR 86 | Resp 20 | Ht 65.0 in | Wt 140.9 lb

## 2023-06-28 DIAGNOSIS — R7402 Elevation of levels of lactic acid dehydrogenase (LDH): Secondary | ICD-10-CM | POA: Diagnosis not present

## 2023-06-28 DIAGNOSIS — H3530 Unspecified macular degeneration: Secondary | ICD-10-CM | POA: Diagnosis not present

## 2023-06-28 DIAGNOSIS — Z800 Family history of malignant neoplasm of digestive organs: Secondary | ICD-10-CM | POA: Diagnosis not present

## 2023-06-28 DIAGNOSIS — R54 Age-related physical debility: Secondary | ICD-10-CM | POA: Insufficient documentation

## 2023-06-28 DIAGNOSIS — I4891 Unspecified atrial fibrillation: Secondary | ICD-10-CM | POA: Diagnosis not present

## 2023-06-28 DIAGNOSIS — Z881 Allergy status to other antibiotic agents status: Secondary | ICD-10-CM | POA: Insufficient documentation

## 2023-06-28 DIAGNOSIS — D61818 Other pancytopenia: Secondary | ICD-10-CM | POA: Insufficient documentation

## 2023-06-28 DIAGNOSIS — Z7901 Long term (current) use of anticoagulants: Secondary | ICD-10-CM | POA: Diagnosis not present

## 2023-06-28 DIAGNOSIS — R531 Weakness: Secondary | ICD-10-CM | POA: Diagnosis not present

## 2023-06-28 DIAGNOSIS — D649 Anemia, unspecified: Secondary | ICD-10-CM | POA: Diagnosis present

## 2023-06-28 DIAGNOSIS — Z9049 Acquired absence of other specified parts of digestive tract: Secondary | ICD-10-CM | POA: Insufficient documentation

## 2023-06-28 DIAGNOSIS — Z8673 Personal history of transient ischemic attack (TIA), and cerebral infarction without residual deficits: Secondary | ICD-10-CM | POA: Insufficient documentation

## 2023-06-28 DIAGNOSIS — N189 Chronic kidney disease, unspecified: Secondary | ICD-10-CM | POA: Diagnosis not present

## 2023-06-28 DIAGNOSIS — F0390 Unspecified dementia without behavioral disturbance: Secondary | ICD-10-CM | POA: Diagnosis not present

## 2023-06-28 DIAGNOSIS — R5383 Other fatigue: Secondary | ICD-10-CM | POA: Diagnosis not present

## 2023-06-28 DIAGNOSIS — Z79899 Other long term (current) drug therapy: Secondary | ICD-10-CM | POA: Insufficient documentation

## 2023-06-28 DIAGNOSIS — Z803 Family history of malignant neoplasm of breast: Secondary | ICD-10-CM | POA: Insufficient documentation

## 2023-06-28 DIAGNOSIS — J449 Chronic obstructive pulmonary disease, unspecified: Secondary | ICD-10-CM | POA: Diagnosis not present

## 2023-06-28 DIAGNOSIS — I129 Hypertensive chronic kidney disease with stage 1 through stage 4 chronic kidney disease, or unspecified chronic kidney disease: Secondary | ICD-10-CM | POA: Insufficient documentation

## 2023-06-28 DIAGNOSIS — M3214 Glomerular disease in systemic lupus erythematosus: Secondary | ICD-10-CM | POA: Diagnosis not present

## 2023-06-28 DIAGNOSIS — Z90710 Acquired absence of both cervix and uterus: Secondary | ICD-10-CM | POA: Insufficient documentation

## 2023-06-28 DIAGNOSIS — R970 Elevated carcinoembryonic antigen [CEA]: Secondary | ICD-10-CM | POA: Insufficient documentation

## 2023-06-28 DIAGNOSIS — Z87891 Personal history of nicotine dependence: Secondary | ICD-10-CM | POA: Diagnosis not present

## 2023-06-28 LAB — CBC WITH DIFFERENTIAL (CANCER CENTER ONLY)
Abs Immature Granulocytes: 0 10*3/uL (ref 0.00–0.07)
Basophils Absolute: 0 10*3/uL (ref 0.0–0.1)
Basophils Relative: 0 %
Eosinophils Absolute: 0.1 10*3/uL (ref 0.0–0.5)
Eosinophils Relative: 3 %
HCT: 24.6 % — ABNORMAL LOW (ref 36.0–46.0)
Hemoglobin: 8.2 g/dL — ABNORMAL LOW (ref 12.0–15.0)
Immature Granulocytes: 0 %
Lymphocytes Relative: 60 %
Lymphs Abs: 1.5 10*3/uL (ref 0.7–4.0)
MCH: 35.8 pg — ABNORMAL HIGH (ref 26.0–34.0)
MCHC: 33.3 g/dL (ref 30.0–36.0)
MCV: 107.4 fL — ABNORMAL HIGH (ref 80.0–100.0)
Monocytes Absolute: 0.3 10*3/uL (ref 0.1–1.0)
Monocytes Relative: 12 %
Neutro Abs: 0.6 10*3/uL — ABNORMAL LOW (ref 1.7–7.7)
Neutrophils Relative %: 25 %
Platelet Count: 76 10*3/uL — ABNORMAL LOW (ref 150–400)
RBC: 2.29 MIL/uL — ABNORMAL LOW (ref 3.87–5.11)
RDW: 18.2 % — ABNORMAL HIGH (ref 11.5–15.5)
WBC Count: 2.5 10*3/uL — ABNORMAL LOW (ref 4.0–10.5)
WBC Morphology: ABNORMAL
nRBC: 0.04 % (ref 0.0–0.2)
nRBC: 2 /100{WBCs} — ABNORMAL HIGH

## 2023-06-28 LAB — CMP (CANCER CENTER ONLY)
ALT: 6 U/L (ref 0–44)
AST: 19 U/L (ref 15–41)
Albumin: 4 g/dL (ref 3.5–5.0)
Alkaline Phosphatase: 62 U/L (ref 38–126)
Anion gap: 10 (ref 5–15)
BUN: 21 mg/dL (ref 8–23)
CO2: 25 mmol/L (ref 22–32)
Calcium: 9.2 mg/dL (ref 8.9–10.3)
Chloride: 104 mmol/L (ref 98–111)
Creatinine: 0.91 mg/dL (ref 0.44–1.00)
GFR, Estimated: 58 mL/min — ABNORMAL LOW (ref >60)
Glucose, Bld: 98 mg/dL (ref 70–99)
Potassium: 4.8 mmol/L (ref 3.5–5.1)
Sodium: 139 mmol/L (ref 135–145)
Total Bilirubin: 0.3 mg/dL (ref <1.2)
Total Protein: 6.9 g/dL (ref 6.5–8.1)

## 2023-06-28 LAB — RETICULOCYTES
Immature Retic Fract: 15.6 % (ref 2.3–15.9)
RBC.: 2.28 MIL/uL — ABNORMAL LOW (ref 3.87–5.11)
Retic Count, Absolute: 127.2 10*3/uL (ref 19.0–186.0)
Retic Ct Pct: 5.6 % — ABNORMAL HIGH (ref 0.4–3.1)

## 2023-06-28 LAB — DIRECT ANTIGLOBULIN TEST (NOT AT ARMC)
DAT, IgG: NEGATIVE
DAT, complement: NEGATIVE

## 2023-06-28 LAB — FERRITIN: Ferritin: 184 ng/mL (ref 11–307)

## 2023-06-28 LAB — CEA (ACCESS): CEA (CHCC): 5.08 ng/mL — ABNORMAL HIGH (ref 0.00–5.00)

## 2023-06-28 LAB — VITAMIN B12: Vitamin B-12: 244 pg/mL (ref 180–914)

## 2023-06-28 LAB — HIV ANTIBODY (ROUTINE TESTING W REFLEX): HIV Screen 4th Generation wRfx: NONREACTIVE

## 2023-06-28 LAB — FOLATE: Folate: 16.9 ng/mL (ref >5.9)

## 2023-06-28 LAB — LACTATE DEHYDROGENASE: LDH: 322 U/L — ABNORMAL HIGH (ref 98–192)

## 2023-06-28 NOTE — Progress Notes (Signed)
[facility] Cancer Initial Visit:  Patient Care Team: Care, Staywell Senior as PCP - General  CHIEF COMPLAINTS/PURPOSE OF CONSULTATION:  HISTORY OF PRESENTING ILLNESS: Gail Mcclure 89 y.o. female is here because of  anemia.  Medical history notable for atherosclerosis, atrial fibrillation for which she is on chronic anticoagulation with Eliquis, CVA, chronic kidney disease, COPD, hypertension, macular degeneration, dementia  January 04, 2023: EGD demonstrated stricture at GE junction which was dilated.  Some scattered erythema in the body and antrum of the stomach.    June 09, 2023: Admitted to [facility] health with 2-week history of increasing weakness.   WBC 2.7 hemoglobin 6.7 platelet count 38; 27 segs 58 lymphs 10 monos on admission.  Morphology was also notable for teardrops Chemistries notable for creatinine of 0.9 glucose 153 BNP 3070.  Transaminases normal.  B12 353 Received packed red blood cells during the hospitalization  June 20, 2023: WBC 1.7 hemoglobin 8.2 platelet count 41  June 27 2023:  [facility] Hematology Consult Family reports that patient is fatigued and sleeping a lot.  Exhausted with minimal activity.  No significant weight loss over the past year but is noted to be anorectic.    Social:  Lives at home.  Goes to adult day care during the week.  A son lives with her during the week.  Other family members take care of her during the weekend.  Worked in a Museum/gallery curator.  Quit smoking in 1962.  EtOH rare in the past.      Review of Systems  Constitutional:  Positive for appetite change and fatigue. Negative for chills, fever and unexpected weight change.  HENT:   Positive for trouble swallowing. Negative for mouth sores, nosebleeds, sore throat and voice change.   Eyes:        Vision changes:  None  Respiratory:  Negative for chest tightness, hemoptysis and wheezing.        Occasional cough.  Had transient SOB following the recent  hospitalization  Cardiovascular:  Negative for leg swelling.       Had a bout of chest pain last week.  Occasional palpitations  Gastrointestinal:  Negative for abdominal pain, blood in stool, constipation, nausea and vomiting.       Has experienced loose stools  Endocrine: Negative for hot flashes.       Cold intolerance:  none Heat intolerance:  none  Genitourinary:  Negative for bladder incontinence, difficulty urinating, dysuria, frequency, hematuria and nocturia.   Musculoskeletal:  Positive for gait problem and neck pain. Negative for back pain and neck stiffness.       Has recently told family members that she has generalized pain  Skin:  Negative for itching, rash and wound.  Neurological:  Positive for extremity weakness and gait problem. Negative for headaches, light-headedness, numbness and speech difficulty.       Ambulates with a cane at home.  At [facility] uses a walker with belt.  No recent falls  Hematological:  Negative for adenopathy. Does not bruise/bleed easily.  Psychiatric/Behavioral:  Negative for sleep disturbance and suicidal ideas. The patient is not nervous/anxious.     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    Atherosclerosis of aorta (HCC)    Atrial fib/flutter, transient (HCC)    Atrial fibrillation (HCC)    Cerebrovascular accident (CVA) (HCC)    Chronic kidney disease    COPD (chronic obstructive pulmonary disease) (  HCC)    Depression    Displaced intertrochanteric fracture of right femur (HCC)    Diverticulitis    Diverticulosis    Dysphagia    GERD (gastroesophageal reflux disease)    High cholesterol    Hyperlipidemia    Hypertension    Hypertensive retinopathy of both eyes    Incontinence of feces    Insomnia    Iron deficiency anemia secondary to blood loss (chronic)    Macular degeneration    Other abnormalities of gait and mobility    Vascular dementia,  unspecified severity, with agitation (HCC)     SURGICAL HISTORY: Past Surgical History:  Procedure Laterality Date   ABDOMINAL HYSTERECTOMY     APPENDECTOMY     CHOLECYSTECTOMY     ESOPHAGEAL DILATION     Kidney Tact     KNEE SURGERY     PARTIAL HIP ARTHROPLASTY     TONSILLECTOMY     TOTAL HIP ARTHROPLASTY      SOCIAL HISTORY: Social History   Socioeconomic History   Marital status: Widowed    Spouse name: Not on file   Number of children: Not on file   Years of education: Not on file   Highest education level: Not on file  Occupational History   Not on file  Tobacco Use   Smoking status: Former   Smokeless tobacco: Never  Vaping Use   Vaping status: Never Used  Substance and Sexual Activity   Alcohol use: No   Drug use: No   Sexual activity: Not on file  Other Topics Concern   Not on file  Social History Narrative   Not on file   Social Determinants of Health   Financial Resource Strain: Not on file  Food Insecurity: No Food Insecurity (06/28/2023)   Hunger Vital Sign    Worried About Running Out of Food in the Last Year: Never true    Ran Out of Food in the Last Year: Never true  Transportation Needs: No Transportation Needs (06/28/2023)   PRAPARE - Administrator, Civil Service (Medical): No    Lack of Transportation (Non-Medical): No  Physical Activity: Not on file  Stress: Not on file  Social Connections: Not on file  Intimate Partner Violence: Not At Risk (06/28/2023)   Humiliation, Afraid, Rape, and Kick questionnaire    Fear of Current or Ex-Partner: No    Emotionally Abused: No    Physically Abused: No    Sexually Abused: No    FAMILY HISTORY Family History  Problem Relation Age of Onset   Pancreatic cancer Mother    Breast cancer Sister     ALLERGIES:  is allergic to azithromycin, nystatin, and tetracycline.  MEDICATIONS:  Current Outpatient Medications  Medication Sig Dispense Refill   acetaminophen (TYLENOL) 500 MG tablet  Take by mouth.     bisoprolol (ZEBETA) 5 MG tablet Take 5 mg by mouth daily.     cholestyramine (QUESTRAN) 4 g packet Take 4 g by mouth.     donepezil (ARICEPT) 10 MG tablet Take 10 mg by mouth every evening.  3   fexofenadine (ALLEGRA) 180 MG tablet Take 180 mg by mouth daily.     Iron-Vitamin C 65-125 MG TABS Take by mouth.     magnesium hydroxide (MILK OF MAGNESIA) 400 MG/5ML suspension Take by mouth daily as needed for mild constipation.     memantine (NAMENDA) 5 MG tablet Take 5 mg by mouth 2 (two) times daily.  METHENAMINE HIPPURATE PO Take 1 g by mouth daily at 12 noon.     pantoprazole (PROTONIX) 40 MG tablet Take 40 mg by mouth daily.     phenylephrine-shark liver oil-mineral oil-petrolatum (PREPARATION H) 0.25-14-74.9 % rectal ointment Place 1 Application rectally.     traZODone (DESYREL) 50 MG tablet Take 50 mg by mouth at bedtime.     calcium carbonate (OS-CAL) 1250 (500 Ca) MG chewable tablet Chew 1 tablet by mouth daily. (Patient not taking: Reported on 06/28/2023)     Polyethylene Glycol 3350 (MIRALAX PO) Take by mouth. (Patient not taking: Reported on 06/28/2023)     No current facility-administered medications for this visit.    PHYSICAL EXAMINATION:  ECOG PERFORMANCE STATUS: 3 - Symptomatic, >50% confined to bed   Vitals:   06/28/23 1520  BP: (!) 98/56  Pulse: 86  Resp: 20  SpO2: 99%    Filed Weights   06/28/23 1520  Weight: 140 lb 14.4 oz (63.9 kg)     Physical Exam Vitals and nursing note reviewed.  Constitutional:      General: She is not in acute distress.    Appearance: Normal appearance. She is normal weight. She is not toxic-appearing or diaphoretic.     Comments: Here with family members.  Frail elderly.  Seated in wheelchair  HENT:     Head: Normocephalic and atraumatic.     Right Ear: External ear normal.     Left Ear: External ear normal.     Nose: Nose normal. No congestion or rhinorrhea.  Eyes:     General: No scleral icterus.     Extraocular Movements: Extraocular movements intact.     Conjunctiva/sclera: Conjunctivae normal.     Pupils: Pupils are equal, round, and reactive to light.  Cardiovascular:     Rate and Rhythm: Normal rate.     Heart sounds: No murmur heard.    No friction rub. No gallop.  Abdominal:     General: Bowel sounds are normal.     Palpations: Abdomen is soft.     Tenderness: There is no abdominal tenderness. There is no guarding or rebound.     Hernia: No hernia is present.  Musculoskeletal:        General: No swelling, tenderness or deformity.     Cervical back: Normal range of motion and neck supple. No rigidity or tenderness.     Right lower leg: No edema.     Left lower leg: No edema.  Lymphadenopathy:     Head:     Right side of head: No submental, submandibular, tonsillar, preauricular, posterior auricular or occipital adenopathy.     Left side of head: No submental, submandibular, tonsillar, preauricular, posterior auricular or occipital adenopathy.     Cervical: No cervical adenopathy.     Right cervical: No superficial, deep or posterior cervical adenopathy.    Left cervical: No superficial, deep or posterior cervical adenopathy.     Upper Body:     Right upper body: No supraclavicular, axillary, pectoral or epitrochlear adenopathy.     Left upper body: No supraclavicular, axillary, pectoral or epitrochlear adenopathy.  Skin:    General: Skin is warm.     Coloration: Skin is pale. Skin is not jaundiced.     Findings: No bruising or erythema.  Neurological:     General: No focal deficit present.     Mental Status: She is alert and oriented to person, place, and time.     Cranial Nerves: No cranial  nerve deficit.  Psychiatric:        Mood and Affect: Mood normal.     Comments: Cooperative.  Follows simple commands      LABORATORY DATA: I have personally reviewed the data as listed:  No visits with results within 1 Month(s) from this visit.  Latest known visit with  results is:  Admission on 03/13/2015, Discharged on 03/15/2015  Component Date Value Ref Range Status   Sodium 03/13/2015 141  135 - 145 mmol/L Final   Potassium 03/13/2015 4.2  3.5 - 5.1 mmol/L Final   Chloride 03/13/2015 107  101 - 111 mmol/L Final   CO2 03/13/2015 24  22 - 32 mmol/L Final   Glucose, Bld 03/13/2015 100 (H)  65 - 99 mg/dL Final   BUN 52/84/1324 8  6 - 20 mg/dL Final   Creatinine, Ser 03/13/2015 0.66  0.44 - 1.00 mg/dL Final   Calcium 40/04/2724 9.8  8.9 - 10.3 mg/dL Final   GFR calc non Af Amer 03/13/2015 >60  >60 mL/min Final   GFR calc Af Amer 03/13/2015 >60  >60 mL/min Final   Comment: (NOTE) The eGFR has been calculated using the CKD EPI equation. This calculation has not been validated in all clinical situations. eGFR's persistently <60 mL/min signify possible Chronic Kidney Disease.    Anion gap 03/13/2015 10  5 - 15 Final   WBC 03/13/2015 8.4  4.0 - 10.5 K/uL Final   RBC 03/13/2015 4.50  3.87 - 5.11 MIL/uL Final   Hemoglobin 03/13/2015 14.4  12.0 - 15.0 g/dL Final   HCT 36/64/4034 41.9  36.0 - 46.0 % Final   MCV 03/13/2015 93.1  78.0 - 100.0 fL Final   MCH 03/13/2015 32.0  26.0 - 34.0 pg Final   MCHC 03/13/2015 34.4  30.0 - 36.0 g/dL Final   RDW 74/25/9563 12.7  11.5 - 15.5 % Final   Platelets 03/13/2015 306  150 - 400 K/uL Final   Troponin I 03/13/2015 <0.03  <0.031 ng/mL Final   Comment:        NO INDICATION OF MYOCARDIAL INJURY.    Troponin i, poc 03/13/2015 0.01  0.00 - 0.08 ng/mL Final   Comment 3 03/13/2015          Final   Comment: Due to the release kinetics of cTnI, a negative result within the first hours of the onset of symptoms does not rule out myocardial infarction with certainty. If myocardial infarction is still suspected, repeat the test at appropriate intervals.    TSH 03/13/2015 2.402  0.350 - 4.500 uIU/mL Final   Troponin I 03/13/2015 <0.03  <0.031 ng/mL Final   Comment:        NO INDICATION OF MYOCARDIAL INJURY.     Troponin I 03/14/2015 <0.03  <0.031 ng/mL Final   Comment:        NO INDICATION OF MYOCARDIAL INJURY.    Rest HR 03/14/2015 68  bpm Final   Rest BP 03/14/2015 150/59  mmHg Final   Peak HR 03/14/2015 105  bpm Final   Peak BP 03/14/2015 155/46  mmHg Final   MPHR 03/14/2015 133  bpm Final   Percent HR 03/14/2015 78  % Final   LV sys vol 03/14/2015 9  mL Final   TID 03/14/2015 1.03   Final   LV dias vol 03/14/2015 40  mL Final   RATE 03/14/2015 0.31   Final   SSS 03/14/2015 7   Final   SRS 03/14/2015 5   Final  SDS 03/14/2015 3   Final   WBC 03/14/2015 6.2  4.0 - 10.5 K/uL Final   RBC 03/14/2015 4.14  3.87 - 5.11 MIL/uL Final   Hemoglobin 03/14/2015 13.1  12.0 - 15.0 g/dL Final   HCT 16/04/9603 39.1  36.0 - 46.0 % Final   MCV 03/14/2015 94.4  78.0 - 100.0 fL Final   MCH 03/14/2015 31.6  26.0 - 34.0 pg Final   MCHC 03/14/2015 33.5  30.0 - 36.0 g/dL Final   RDW 54/03/8118 12.9  11.5 - 15.5 % Final   Platelets 03/14/2015 285  150 - 400 K/uL Final   Troponin I 03/14/2015 <0.03  <0.031 ng/mL Final   Comment:        NO INDICATION OF MYOCARDIAL INJURY.    WBC 03/15/2015 6.2  4.0 - 10.5 K/uL Final   RBC 03/15/2015 4.01  3.87 - 5.11 MIL/uL Final   Hemoglobin 03/15/2015 12.7  12.0 - 15.0 g/dL Final   HCT 14/78/2956 38.0  36.0 - 46.0 % Final   MCV 03/15/2015 94.8  78.0 - 100.0 fL Final   MCH 03/15/2015 31.7  26.0 - 34.0 pg Final   MCHC 03/15/2015 33.4  30.0 - 36.0 g/dL Final   RDW 21/30/8657 12.9  11.5 - 15.5 % Final   Platelets 03/15/2015 270  150 - 400 K/uL Final    RADIOGRAPHIC STUDIES: I have personally reviewed the radiological images as listed and agree with the findings in the report  No results found.  ASSESSMENT/PLAN  89 y.o. female is here because of  anemia.  Medical history notable for atherosclerosis, atrial fibrillation for which she is on chronic anticoagulation with Eliquis, CVA, chronic kidney disease, COPD, hypertension, macular degeneration,  dementia  Pancytopenia Acquired   Bone marrow infiltration/Replacement     Malignant      Acute leukemias Chronic leukemias/myeloproliferative neoplasms (MPN) Myelodysplastic syndromes (MDS) Multiple myeloma Metastatic cancer     Non Malignant      Myelofibrosis Infectious (eg, fungal, tuberculous) Storage diseases    Bone marrow failure     Immune destruction/suppression      Aplastic Anemia/PNH      Medications       Cytotoxic drugs Idiosyncratic reactions to medications            Large granular lymphocyte leukemia Autoimmune disorders (eg, systemic lupus erythematosus [SLE], rheumatoid arthritis [RA], sarcoidosis) Hemophagocytic lymphohistiocytosis (HLH)      Nutritional      Nutritional Megaloblastic (vitamin B12, folate) Excessive alcohol Other (eg, copper deficiency, zinc toxicity) Malnutrition/anorexia nervosa with gelatinous degeneration      Marrow Suppression      Viral infection (HIV, hepatitis, EBV, Parvo B19)      Ineffective Hematopoeisis (MDS, nutritional)    Destruction/sequestration/redistribution     Consumption      DIC (sepsis, APL)      Splenomegaly      Portal hypertension/cirrhosis Infections (eg, EBV) Autoimmune disorders (eg, SLE, RA/Felty syndrome) Malignancies (eg, lymphomas, MPN) Myelofibrosis with myeloid metaplasia Storage diseases (eg, Gaucher)    Congenital    Wiskott Aldrich syndrome Fanconi anemia Dyskeratosis congenital/telomere biology disorders Shwachman-Diamond syndrome GATA2 deficiency Hemophagocytic lymphohistiocytosis (HLH)     Will begin by obtaining the following: CBC with diff, CMP, LDH, Retic count, smear for morphology view.  Ferritin, B12, folate, ANA, RF,  coper and zinc levels, Hepatitis ABC serologies, HIV,  SPEP with IEP, free light chains, IgGAM.    Patient is frail elderly therefore will hold on bone marrow biopsy and  aspirate to investigate.      Cancer Staging  No matching staging information was  found for the patient.    No problem-specific Assessment & Plan notes found for this encounter.    Orders Placed This Encounter  Procedures   CBC with Differential/Platelet    Standing Status:   Future    Standing Expiration Date:   06/27/2024   Comprehensive metabolic panel    Standing Status:   Future    Standing Expiration Date:   06/27/2024   Copper, serum    Standing Status:   Future    Standing Expiration Date:   06/27/2024   Ferritin    Standing Status:   Future    Standing Expiration Date:   06/27/2024   Folate    Standing Status:   Future    Standing Expiration Date:   06/27/2024   Haptoglobin    Standing Status:   Future    Standing Expiration Date:   06/27/2024   Zinc    Standing Status:   Future    Standing Expiration Date:   06/27/2024   Vitamin B12    Standing Status:   Future    Standing Expiration Date:   06/27/2024   Reticulocytes    Standing Status:   Future    Standing Expiration Date:   06/27/2024   Multiple Myeloma Panel (SPEP&IFE w/QIG)    Standing Status:   Future    Standing Expiration Date:   06/27/2024   Kappa/lambda light chains    Standing Status:   Future    Standing Expiration Date:   06/27/2024   Cancer antigen 15-3    Standing Status:   Future    Standing Expiration Date:   06/27/2024   Cancer antigen 19-9    Standing Status:   Future    Standing Expiration Date:   06/27/2024   CEA    Standing Status:   Future    Standing Expiration Date:   06/27/2024   Lactate dehydrogenase    Standing Status:   Future    Standing Expiration Date:   06/27/2024   ANA Comprehensive Panel    Standing Status:   Future    Standing Expiration Date:   06/27/2024   Hepatitis panel, acute    Standing Status:   Future    Standing Expiration Date:   06/27/2024   HIV Antibody (routine testing w rflx)    Standing Status:   Future    Standing Expiration Date:   06/27/2024   Rheumatoid factor    Standing Status:   Future    Standing Expiration Date:   06/27/2024    Technologist smear review    Standing Status:   Future    Standing Expiration Date:   06/27/2024    Order Specific Question:   Clinical information:    Answer:   pancytopenia   Direct antiglobulin test (not at [facility])    Standing Status:   Future    Standing Expiration Date:   06/27/2024      minutes was spent in patient care.  This included time spent preparing to see the patient (e.g., review of tests), obtaining and/or reviewing separately obtained history, counseling and educating the patient/family/caregiver, ordering medications, tests, or procedures; documenting clinical information in the electronic or other health record, independently interpreting results and communicating results to the patient/family/caregiver as well as coordination of care.       All questions were answered. The patient knows to call the clinic with any  problems, questions or concerns.  This note was electronically signed.    Loni Muse, MD  06/28/2023 3:56 PM

## 2023-06-29 ENCOUNTER — Telehealth: Payer: Self-pay | Admitting: Oncology

## 2023-06-29 LAB — ANA COMPREHENSIVE PANEL
Anti JO-1: <0.2 AI (ref 0.0–0.9)
Centromere Ab Screen: <0.2 AI (ref 0.0–0.9)
Chromatin Ab SerPl-aCnc: <0.2 AI (ref 0.0–0.9)
ENA SM Ab Ser-aCnc: <0.2 AI (ref 0.0–0.9)
Ribonucleic Protein: <0.2 AI (ref 0.0–0.9)
SSA (Ro) (ENA) Antibody, IgG: <0.2 AI (ref 0.0–0.9)
SSB (La) (ENA) Antibody, IgG: <0.2 AI (ref 0.0–0.9)
Scleroderma (Scl-70) (ENA) Antibody, IgG: <0.2 AI (ref 0.0–0.9)
ds DNA Ab: 1 [IU]/mL (ref 0–9)

## 2023-06-29 LAB — HEPATITIS PANEL, ACUTE
HCV Ab: NONREACTIVE
Hep A IgM: NONREACTIVE
Hep B C IgM: NONREACTIVE
Hepatitis B Surface Ag: NONREACTIVE

## 2023-06-29 LAB — ZINC: Zinc: 65 ug/dL (ref 44–115)

## 2023-06-29 LAB — KAPPA/LAMBDA LIGHT CHAINS
Kappa free light chain: 61.3 mg/L — ABNORMAL HIGH (ref 3.3–19.4)
Kappa, lambda light chain ratio: 1.48 (ref 0.26–1.65)
Lambda free light chains: 41.5 mg/L — ABNORMAL HIGH (ref 5.7–26.3)

## 2023-06-29 LAB — COPPER, SERUM: Copper: 112 ug/dL (ref 80–158)

## 2023-06-29 LAB — CANCER ANTIGEN 19-9: CA 19-9: 24 U/mL (ref 0–35)

## 2023-06-29 LAB — CANCER ANTIGEN 15-3: CA 15-3: 19.7 U/mL (ref 0.0–25.0)

## 2023-06-29 NOTE — Telephone Encounter (Signed)
06/29/23 LVM SCHEDULED NEXT APPT IN ONE WEEK.

## 2023-06-30 LAB — MULTIPLE MYELOMA PANEL, SERUM
Albumin SerPl Elph-Mcnc: 3.8 g/dL (ref 2.9–4.4)
Albumin/Glob SerPl: 1.4 (ref 0.7–1.7)
Alpha 1: 0.2 g/dL (ref 0.0–0.4)
Alpha2 Glob SerPl Elph-Mcnc: 0.5 g/dL (ref 0.4–1.0)
B-Globulin SerPl Elph-Mcnc: 0.9 g/dL (ref 0.7–1.3)
Gamma Glob SerPl Elph-Mcnc: 1.3 g/dL (ref 0.4–1.8)
Globulin, Total: 2.9 g/dL (ref 2.2–3.9)
IgA: 276 mg/dL (ref 64–422)
IgG (Immunoglobin G), Serum: 1272 mg/dL (ref 586–1602)
IgM (Immunoglobulin M), Srm: 107 mg/dL (ref 26–217)
M Protein SerPl Elph-Mcnc: 0.4 g/dL — ABNORMAL HIGH
Total Protein ELP: 6.7 g/dL (ref 6.0–8.5)

## 2023-06-30 LAB — HAPTOGLOBIN: Haptoglobin: 74 mg/dL (ref 41–333)

## 2023-06-30 LAB — RHEUMATOID FACTOR: Rheumatoid fact SerPl-aCnc: <10 [IU]/mL (ref <14.0)

## 2023-07-05 ENCOUNTER — Inpatient Hospital Stay (HOSPITAL_BASED_OUTPATIENT_CLINIC_OR_DEPARTMENT_OTHER): Payer: Medicare (Managed Care) | Admitting: Oncology

## 2023-07-05 VITALS — BP 140/65 | HR 99 | Resp 18 | Ht 65.0 in

## 2023-07-05 DIAGNOSIS — D61818 Other pancytopenia: Secondary | ICD-10-CM

## 2023-07-05 DIAGNOSIS — R54 Age-related physical debility: Secondary | ICD-10-CM

## 2023-07-05 DIAGNOSIS — D649 Anemia, unspecified: Secondary | ICD-10-CM | POA: Diagnosis not present

## 2023-07-05 NOTE — Progress Notes (Signed)
[facility] Cancer Initial Visit:  Patient Care Team: Care, Staywell Senior as PCP - General  CHIEF COMPLAINTS/PURPOSE OF CONSULTATION:  HISTORY OF PRESENTING ILLNESS: Gail Mcclure 89 y.o. female is here because of  anemia.  Medical history notable for atherosclerosis, atrial fibrillation for which she is on chronic anticoagulation with Eliquis, CVA, chronic kidney disease, COPD, hypertension, macular degeneration, dementia  January 04, 2023: EGD demonstrated stricture at GE junction which was dilated.  Some scattered erythema in the body and antrum of the stomach.    June 09, 2023: Admitted to [facility] with 2-week history of increasing weakness.   WBC 2.7 hemoglobin 6.7 platelet count 38; 27 segs 58 lymphs 10 monos on admission.  Morphology was also notable for teardrops Chemistries notable for creatinine of 0.9 glucose 153 BNP 3070.  Transaminases normal.  B12 353 Received packed red blood cells during the hospitalization  June 20, 2023: WBC 1.7 hemoglobin 8.2 platelet count 41  June 27 2023:  [facility] Hematology Consult Family reports that patient is fatigued and sleeping a lot.  Exhausted with minimal activity.  No significant weight loss over the past year but is noted to be anorectic.    Social:  Lives at home.  Goes to adult day care during the week.  A son lives with her during the week.  Other family members take care of her during the weekend.  Worked in a Museum/gallery curator.  Quit smoking in 1962.  EtOH rare in the past.    WBC 2.5 hemoglobin 8.2 MCV 107 platelet count 76; 25 segs 60 lymphs 12 monos 3 eos morphology demonstrated elliptocytes teardrops, giant platelets.  Reticulocyte count 5.6%. Coombs test negative haptoglobin 74 SPEP with IEP demonstrated 0.4 g/dL of an IgG lambda monoclonal protein serum kappa 61.3 lambda 41.5 ratio 1.48 IgG 1272 IgA 276 IgM 107 ANA panel negative CA 15-3 19.7 CA 19-9 24 CEA 5.08.  Hepatitis ABC serologies  negative.  HIV serology negative Ferritin 184 folate 16.9 Copper 112 B12 244 zinc 65 CMP normal LDH 322    Review of Systems  Constitutional:  Positive for appetite change and fatigue. Negative for chills, fever and unexpected weight change.  HENT:   Positive for trouble swallowing. Negative for mouth sores, nosebleeds, sore throat and voice change.   Eyes:        Vision changes:  None  Respiratory:  Negative for chest tightness, hemoptysis and wheezing.        Occasional cough.  Had transient SOB following the recent hospitalization  Cardiovascular:  Negative for leg swelling.       Had a bout of chest pain last week.  Occasional palpitations  Gastrointestinal:  Negative for abdominal pain, blood in stool, constipation, nausea and vomiting.       Has experienced loose stools  Endocrine: Negative for hot flashes.       Cold intolerance:  none Heat intolerance:  none  Genitourinary:  Negative for bladder incontinence, difficulty urinating, dysuria, frequency, hematuria and nocturia.   Musculoskeletal:  Positive for gait problem and neck pain. Negative for back pain and neck stiffness.       Has recently told family members that she has generalized pain  Skin:  Negative for itching, rash and wound.  Neurological:  Positive for extremity weakness and gait problem. Negative for headaches, light-headedness, numbness and speech difficulty.       Ambulates with a cane at home.  At [facility] uses a walker with belt.  No recent falls  Hematological:  Negative for adenopathy. Does not bruise/bleed easily.  Psychiatric/Behavioral:  Negative for sleep disturbance and suicidal ideas. The patient is not nervous/anxious.     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    Atherosclerosis of aorta (HCC)    Atrial fib/flutter, transient (HCC)    Atrial fibrillation (HCC)    Cerebrovascular accident (CVA) (HCC)    Chronic  kidney disease    COPD (chronic obstructive pulmonary disease) (HCC)    Depression    Displaced intertrochanteric fracture of right femur (HCC)    Diverticulitis    Diverticulosis    Dysphagia    GERD (gastroesophageal reflux disease)    High cholesterol    Hyperlipidemia    Hypertension    Hypertensive retinopathy of both eyes    Incontinence of feces    Insomnia    Iron deficiency anemia secondary to blood loss (chronic)    Macular degeneration    Other abnormalities of gait and mobility    Vascular dementia, unspecified severity, with agitation (HCC)     SURGICAL HISTORY: Past Surgical History:  Procedure Laterality Date   ABDOMINAL HYSTERECTOMY     APPENDECTOMY     CHOLECYSTECTOMY     ESOPHAGEAL DILATION     Kidney Tact     KNEE SURGERY     PARTIAL HIP ARTHROPLASTY     TONSILLECTOMY     TOTAL HIP ARTHROPLASTY      SOCIAL HISTORY: Social History   Socioeconomic History   Marital status: Widowed    Spouse name: Not on file   Number of children: Not on file   Years of education: Not on file   Highest education level: Not on file  Occupational History   Not on file  Tobacco Use   Smoking status: Former   Smokeless tobacco: Never  Vaping Use   Vaping status: Never Used  Substance and Sexual Activity   Alcohol use: No   Drug use: No   Sexual activity: Not on file  Other Topics Concern   Not on file  Social History Narrative   Not on file   Social Determinants of Health   Financial Resource Strain: Not on file  Food Insecurity: No Food Insecurity (06/28/2023)   Hunger Vital Sign    Worried About Running Out of Food in the Last Year: Never true    Ran Out of Food in the Last Year: Never true  Transportation Needs: No Transportation Needs (06/28/2023)   PRAPARE - Administrator, Civil Service (Medical): No    Lack of Transportation (Non-Medical): No  Physical Activity: Not on file  Stress: Not on file  Social Connections: Not on file   Intimate Partner Violence: Not At Risk (06/28/2023)   Humiliation, Afraid, Rape, and Kick questionnaire    Fear of Current or Ex-Partner: No    Emotionally Abused: No    Physically Abused: No    Sexually Abused: No    FAMILY HISTORY Family History  Problem Relation Age of Onset   Pancreatic cancer Mother    Breast cancer Sister     ALLERGIES:  is allergic to azithromycin, nystatin, and tetracycline.  MEDICATIONS:  Current Outpatient Medications  Medication Sig Dispense Refill   acetaminophen (TYLENOL) 500 MG tablet Take by mouth.     bisoprolol (ZEBETA) 5 MG tablet Take 5 mg by mouth daily.     calcium carbonate (OS-CAL)  1250 (500 Ca) MG chewable tablet Chew 1 tablet by mouth daily. (Patient not taking: Reported on 06/28/2023)     cholestyramine (QUESTRAN) 4 g packet Take 4 g by mouth.     donepezil (ARICEPT) 10 MG tablet Take 10 mg by mouth every evening.  3   fexofenadine (ALLEGRA) 180 MG tablet Take 180 mg by mouth daily.     Iron-Vitamin C 65-125 MG TABS Take by mouth.     magnesium hydroxide (MILK OF MAGNESIA) 400 MG/5ML suspension Take by mouth daily as needed for mild constipation.     memantine (NAMENDA) 5 MG tablet Take 5 mg by mouth 2 (two) times daily.     METHENAMINE HIPPURATE PO Take 1 g by mouth daily at 12 noon.     pantoprazole (PROTONIX) 40 MG tablet Take 40 mg by mouth daily.     phenylephrine-shark liver oil-mineral oil-petrolatum (PREPARATION H) 0.25-14-74.9 % rectal ointment Place 1 Application rectally.     Polyethylene Glycol 3350 (MIRALAX PO) Take by mouth. (Patient not taking: Reported on 06/28/2023)     traZODone (DESYREL) 50 MG tablet Take 50 mg by mouth at bedtime.     No current facility-administered medications for this visit.    PHYSICAL EXAMINATION:  ECOG PERFORMANCE STATUS: 3 - Symptomatic, >50% confined to bed   There were no vitals filed for this visit.   There were no vitals filed for this visit.    Physical Exam Vitals and nursing  note reviewed.  Constitutional:      General: She is not in acute distress.    Appearance: Normal appearance. She is normal weight. She is not toxic-appearing or diaphoretic.     Comments: Here with family members.  Frail elderly.  Seated in wheelchair  HENT:     Head: Normocephalic and atraumatic.     Right Ear: External ear normal.     Left Ear: External ear normal.     Nose: Nose normal. No congestion or rhinorrhea.  Eyes:     General: No scleral icterus.    Extraocular Movements: Extraocular movements intact.     Conjunctiva/sclera: Conjunctivae normal.     Pupils: Pupils are equal, round, and reactive to light.  Cardiovascular:     Rate and Rhythm: Normal rate.     Heart sounds: No murmur heard.    No friction rub. No gallop.  Abdominal:     General: Bowel sounds are normal.     Palpations: Abdomen is soft.     Tenderness: There is no abdominal tenderness. There is no guarding or rebound.     Hernia: No hernia is present.  Musculoskeletal:        General: No swelling, tenderness or deformity.     Cervical back: Normal range of motion and neck supple. No rigidity or tenderness.     Right lower leg: No edema.     Left lower leg: No edema.  Lymphadenopathy:     Head:     Right side of head: No submental, submandibular, tonsillar, preauricular, posterior auricular or occipital adenopathy.     Left side of head: No submental, submandibular, tonsillar, preauricular, posterior auricular or occipital adenopathy.     Cervical: No cervical adenopathy.     Right cervical: No superficial, deep or posterior cervical adenopathy.    Left cervical: No superficial, deep or posterior cervical adenopathy.     Upper Body:     Right upper body: No supraclavicular, axillary, pectoral or epitrochlear adenopathy.  Left upper body: No supraclavicular, axillary, pectoral or epitrochlear adenopathy.  Skin:    General: Skin is warm.     Coloration: Skin is pale. Skin is not jaundiced.      Findings: No bruising or erythema.  Neurological:     General: No focal deficit present.     Mental Status: She is alert and oriented to person, place, and time.     Cranial Nerves: No cranial nerve deficit.  Psychiatric:        Mood and Affect: Mood normal.     Comments: Cooperative.  Follows simple commands     LABORATORY DATA: I have personally reviewed the data as listed:  Appointment on 06/28/2023  Component Date Value Ref Range Status   Rheumatoid fact SerPl-aCnc 06/28/2023 <10.0  <14.0 IU/mL Final   Comment: (NOTE) Performed At: [facility] [address] [place], [place] [id number] Jolene Schimke MD GE:[id number]    HIV Screen 4th Generation wRfx 06/28/2023 Non Reactive  Non Reactive Final   Performed at [facility], [address] [place], [place] [address]   Hepatitis B Surface Ag 06/28/2023 NON REACTIVE  NON REACTIVE Final   HCV Ab 06/28/2023 NON REACTIVE  NON REACTIVE Final   Comment: (NOTE) Nonreactive HCV antibody screen is consistent with no HCV infections,  unless recent infection is suspected or other evidence exists to indicate HCV infection.     Hep A IgM 06/28/2023 NON REACTIVE  NON REACTIVE Final   Hep B C IgM 06/28/2023 NON REACTIVE  NON REACTIVE Final   Performed at [facility], [address] [place], [place] [address]   ds DNA Ab 06/28/2023 1  0 - 9 IU/mL Final   Comment: (NOTE)                                   Negative      <5                                   Equivocal  5 - 9                                   Positive      >9    Ribonucleic Protein 06/28/2023 <0.2  0.0 - 0.9 AI Final   ENA SM Ab Ser-aCnc 06/28/2023 <0.2  0.0 - 0.9 AI Final   Scleroderma (Scl-70) (ENA) Antibod* 06/28/2023 <0.2  0.0 - 0.9 AI Final   SSA (Ro) (ENA) Antibody, IgG 06/28/2023 <0.2  0.0 - 0.9 AI Final   SSB (La) (ENA) Antibody, IgG 06/28/2023 <0.2  0.0 - 0.9 AI Final   Chromatin Ab SerPl-aCnc 06/28/2023 <0.2  0.0 - 0.9 AI Final   Anti JO-1  06/28/2023 <0.2  0.0 - 0.9 AI Final   Centromere Ab Screen 06/28/2023 <0.2  0.0 - 0.9 AI Final   See below: 06/28/2023 Comment   Final   Comment: (NOTE) Autoantibody                       Disease Association ------------------------------------------------------------                        Condition  Frequency ---------------------   ------------------------   --------- Antinuclear Antibody,    SLE, mixed connective Direct (ANA-D)           tissue diseases ---------------------   ------------------------   --------- dsDNA                    SLE                        40 - 60% ---------------------   ------------------------   --------- Chromatin                Drug induced SLE                90%                         SLE                        48 - 97% ---------------------   ------------------------   --------- SSA (Ro)                 SLE                        25 - 35%                         Sjogren's Syndrome         40 - 70%                         Neonatal Lupus                 100% ---------------------   ------------------------   --------- SSB (La)                 SLE                                                       10%                         Sjogren's Syndrome              30% ---------------------   -----------------------    --------- Sm (anti-Smith)          SLE                        15 - 30% ---------------------   -----------------------    --------- RNP                      Mixed Connective Tissue                         Disease                         95% (U1 nRNP,                SLE  30 - 50% anti-ribonucleoprotein)  Polymyositis and/or                         Dermatomyositis                 20% ---------------------   ------------------------   --------- Scl-70 (antiDNA          Scleroderma (diffuse)      20 - 35% topoisomerase)           Crest                           13% ---------------------    ------------------------   --------- Jo-1                     Polymyositis and/or                         Dermatomyositis            20 - 40% ---------------------   ------------------------   --------- Centromere B             Scleroderma -                           Crest                         variant                         80% Performed At: [facility] [address] [place], [place] [id number] Jolene Schimke MD EP:[id number]    LDH 06/28/2023 322 (H)  98 - 192 U/L Final   Performed at [facility], [address], [place], [place], [address]   CA 19-9 06/28/2023 24  0 - 35 U/mL Final   Comment: (NOTE) Roche Diagnostics Electrochemiluminescence Immunoassay (ECLIA) Values obtained with different assay methods or kits cannot be used interchangeably.  Results cannot be interpreted as absolute evidence of the presence or absence of malignant disease. Performed At: [facility] [address] [place], [place] [id number] Jolene Schimke MD FT:[id number]    CA 15-3 06/28/2023 19.7  0.0 - 25.0 U/mL Final   Comment: (NOTE) Roche Diagnostics Electrochemiluminescence Immunoassay (ECLIA) Values obtained with different assay methods or kits cannot be used interchangeably.  Results cannot be interpreted as absolute evidence of the presence or absence of malignant disease. Performed At: [facility] [address] [place], [place] [id number] Jolene Schimke MD TD:[id number]    Kappa free light chain 06/28/2023 61.3 (H)  3.3 - 19.4 mg/L Final   Lambda free light chains 06/28/2023 41.5 (H)  5.7 - 26.3 mg/L Final   Kappa, lambda light chain ratio 06/28/2023 1.48  0.26 - 1.65 Final   Comment: (NOTE) Performed At: [facility] [address] [place], [place] [id number] Jolene Schimke MD OE:[id number]    IgG (Immunoglobin G), Serum 06/28/2023 1,272  586 - 1,602 mg/dL Final   IgA 82/99/3716 276  64 - 422 mg/dL Final   IgM  (Immunoglobulin M), Srm 06/28/2023 107  26 - 217 mg/dL Final   Total Protein ELP 06/28/2023 6.7  6.0 - 8.5 g/dL Corrected   Albumin SerPl Elph-Mcnc 06/28/2023 3.8  2.9 - 4.4 g/dL Corrected   Alpha 1 96/78/9381 0.2  0.0 -  0.4 g/dL Corrected   Alpha2 Glob SerPl Elph-Mcnc 06/28/2023 0.5  0.4 - 1.0 g/dL Corrected   B-Globulin SerPl Elph-Mcnc 06/28/2023 0.9  0.7 - 1.3 g/dL Corrected   Gamma Glob SerPl Elph-Mcnc 06/28/2023 1.3  0.4 - 1.8 g/dL Corrected   M Protein SerPl Elph-Mcnc 06/28/2023 0.4 (H)  Not Observed g/dL Corrected   Globulin, Total 06/28/2023 2.9  2.2 - 3.9 g/dL Corrected   Albumin/Glob SerPl 06/28/2023 1.4  0.7 - 1.7 Corrected   IFE 1 06/28/2023 Comment (A)   Corrected   Comment: (NOTE) Immunofixation shows IgG monoclonal protein with lambda light chain specificity.    Please Note 06/28/2023 Comment   Corrected   Comment: (NOTE) Protein electrophoresis scan will follow via computer, mail, or courier delivery. Performed At: [facility] [place] [facility] [address] [place], [place] [id number] Jolene Schimke MD NW:[id number]    Retic Ct Pct 06/28/2023 5.6 (H)  0.4 - 3.1 % Final   RBC. 06/28/2023 2.28 (L)  3.87 - 5.11 MIL/uL Final   Retic Count, Absolute 06/28/2023 127.2  19.0 - 186.0 K/uL Final   Immature Retic Fract 06/28/2023 15.6  2.3 - 15.9 % Final   Performed at [facility], [address], [place], [place], [address]   Vitamin B-12 06/28/2023 244  180 - 914 pg/mL Final   Comment: (NOTE) This assay is not validated for testing neonatal or myeloproliferative syndrome specimens for Vitamin B12 levels. Performed at [facility], [address], [place], [place] [address]    Zinc 06/28/2023 65  44 - 115 ug/dL Final   Comment: (NOTE) This test was developed and its performance characteristics determined by [facility]. It has not been cleared or approved by the Food and Drug Administration.                                Detection  Limit = 5 Performed At: [facility] [address] [place], [place] [id number] Jolene Schimke MD NU:[id number]    Haptoglobin 06/28/2023 74  41 - 333 mg/dL Final   Comment: (NOTE) Performed At: [facility] [address] [place] [address] [place], [place] [id number] Jolene Schimke MD LO:[id number]    Folate 06/28/2023 16.9  >5.9 ng/mL Final   Performed at [facility], [address], [place], [place] [address]   Ferritin 06/28/2023 184  11 - 307 ng/mL Final   Performed at [facility], [address] [place], [place] [address]   DAT, complement 06/28/2023 NEG   Final   DAT, IgG 06/28/2023    Final                   Value:NEG Performed at [facility], [address] [place], [place] [address]    Copper 06/28/2023 112  80 - 158 ug/dL Final   Comment: (NOTE) This test was developed and its performance characteristics determined by [facility]. It has not been cleared or approved by the Food and Drug Administration.                                Detection Limit = 5 Performed At: [facility] [address] [place], [place] [id number] Jolene Schimke MD KG:[id number]    CEA ([facility]) 06/28/2023 5.08 (H)  0.00 - 5.00 ng/mL Final   Comment: (NOTE) This test was performed using Beckman Coulter's paramagnetic chemiluminescent  immunoassay. Values obtained from different assay methods cannot be used interchangeably. Please note that up to 8% of patients who smoke may see values 5.1-10.0 ng/ml and 1% of patients who smoke may see CEA levels >10.0 ng/ml. Performed at [facility], [address] [place], [place] [address]    WBC Count 06/28/2023 2.5 (L)  4.0 - 10.5 K/uL Final   RBC 06/28/2023 2.29 (L)  3.87 - 5.11 MIL/uL Final   Hemoglobin 06/28/2023 8.2 (L)  12.0 - 15.0 g/dL Final   HCT 54/27/0623 24.6 (L)  36.0 - 46.0 % Final   MCV 06/28/2023 107.4 (H)  80.0 - 100.0 fL Final   MCH 06/28/2023 35.8 (H)   26.0 - 34.0 pg Final   MCHC 06/28/2023 33.3  30.0 - 36.0 g/dL Final   RDW 76/28/3151 18.2 (H)  11.5 - 15.5 % Final   Platelet Count 06/28/2023 76 (L)  150 - 400 K/uL Final   Comment: PLATELET COUNT CONFIRMED BY SMEAR REPEATED TO VERIFY    nRBC 06/28/2023 0.04  0.0 - 0.2 % Final   Neutrophils Relative % 06/28/2023 25  % Final   Lymphocytes Relative 06/28/2023 60  % Final   Monocytes Relative 06/28/2023 12  % Final   Eosinophils Relative 06/28/2023 3  % Final   Basophils Relative 06/28/2023 0  % Final   Immature Granulocytes 06/28/2023 0  % Final   nRBC 06/28/2023 2 (H)  0 /100 WBC Final   Neutro Abs 06/28/2023 0.6 (L)  1.7 - 7.7 K/uL Final   Lymphs Abs 06/28/2023 1.5  0.7 - 4.0 K/uL Final   Monocytes Absolute 06/28/2023 0.3  0.1 - 1.0 K/uL Final   Eosinophils Absolute 06/28/2023 0.1  0.0 - 0.5 K/uL Final   Basophils Absolute 06/28/2023 0.0  0.0 - 0.1 K/uL Final   Abs Immature Granulocytes 06/28/2023 0.00  0.00 - 0.07 K/uL Final   RBC Morphology 06/28/2023 ELLIPTOCYTES   Final   WBC Morphology 06/28/2023 Abnormal lymphocytes present   Final   Smear Review 06/28/2023 GIANT PLATELETS SEEN   Final   Tear Drop Cells 06/28/2023 TEARDROP CELLS   Final   Schistocytes 06/28/2023 PRESENT   Final   Performed at [facility], [address] [place], [place], [address]   Sodium 06/28/2023 139  135 - 145 mmol/L Final   Potassium 06/28/2023 4.8  3.5 - 5.1 mmol/L Final   Chloride 06/28/2023 104  98 - 111 mmol/L Final   CO2 06/28/2023 25  22 - 32 mmol/L Final   Glucose, Bld 06/28/2023 98  70 - 99 mg/dL Final   Glucose reference range applies only to samples taken after fasting for at least 8 hours.   BUN 06/28/2023 21  8 - 23 mg/dL Final   Creatinine 73/71/0626 0.91  0.44 - 1.00 mg/dL Final   Calcium 94/85/4627 9.2  8.9 - 10.3 mg/dL Final   Total Protein 03/50/0938 6.9  6.5 - 8.1 g/dL Final   Albumin 18/29/9371 4.0  3.5 - 5.0 g/dL Final   AST 69/67/8938 19  15 - 41 U/L  Final   ALT 06/28/2023 6  0 - 44 U/L Final   Alkaline Phosphatase 06/28/2023 62  38 - 126 U/L Final   Total Bilirubin 06/28/2023 0.3  <1.2 mg/dL Final   GFR, Estimated 06/28/2023 58 (L)  >60 mL/min Final   Comment: (NOTE) Calculated using the CKD-EPI Creatinine Equation (2021)    Anion gap 06/28/2023 10  5 - 15 Final  Performed at [facility], [address], [place], [place], [address]    RADIOGRAPHIC STUDIES: I have personally reviewed the radiological images as listed and agree with the findings in the report  No results found.  ASSESSMENT/PLAN  89 y.o. female is here because of  anemia.  Medical history notable for atherosclerosis, atrial fibrillation for which she is on chronic anticoagulation with Eliquis, CVA, chronic kidney disease, COPD, hypertension, macular degeneration, dementia  Pancytopenia July 05 2023- Morphology of peripheral smear, coupled with elevated LDH and CEA indicate a myelopthisic picture.  This typically occurs in the setting of malignancy.  With elevations in both LDH and CEA, unable to say if this due to solid or hematologic tumor.  Next steps would typically be to perform a bone marrow bx and/or CT .  Since patient is frail elderly and would not be eligible for cytoreductive therapy will hold on additional evaluation.    Follow up July 05 2023- Family has agreed to PRN follow up.  Discussed consideration of hospice.        Cancer Staging  No matching staging information was found for the patient.    No problem-specific Assessment & Plan notes found for this encounter.    No orders of the defined types were placed in this encounter.   30  minutes was spent in patient care.  This included time spent preparing to see the patient (e.g., review of tests), obtaining and/or reviewing separately obtained history, counseling and educating the patient/family/caregiver, ordering medications, tests, or procedures;  documenting clinical information in the electronic or other health record, independently interpreting results and communicating results to the patient/family/caregiver as well as coordination of care.       All questions were answered. The patient knows to call the clinic with any problems, questions or concerns.  This note was electronically signed.    Loni Muse, MD  07/05/2023 12:58 PM

## 2023-12-25 DEATH — deceased
